# Patient Record
Sex: Female | Born: 1980
Health system: Southern US, Community
[De-identification: ages and names within clinical notes are randomized; demographics above are authoritative.]

## PROBLEM LIST (undated history)

## (undated) DIAGNOSIS — I82409 Acute embolism and thrombosis of unspecified deep veins of unspecified lower extremity: Secondary | ICD-10-CM

## (undated) DIAGNOSIS — E611 Iron deficiency: Secondary | ICD-10-CM

## (undated) DIAGNOSIS — E669 Obesity, unspecified: Secondary | ICD-10-CM

## (undated) DIAGNOSIS — D649 Anemia, unspecified: Secondary | ICD-10-CM

## (undated) DIAGNOSIS — B977 Papillomavirus as the cause of diseases classified elsewhere: Secondary | ICD-10-CM

## (undated) DIAGNOSIS — N83202 Unspecified ovarian cyst, left side: Secondary | ICD-10-CM

## (undated) DIAGNOSIS — E282 Polycystic ovarian syndrome: Secondary | ICD-10-CM

## (undated) DIAGNOSIS — R87619 Unspecified abnormal cytological findings in specimens from cervix uteri: Secondary | ICD-10-CM

## (undated) DIAGNOSIS — E041 Nontoxic single thyroid nodule: Secondary | ICD-10-CM

## (undated) HISTORY — DX: Unspecified abnormal cytological findings in specimens from cervix uteri: R87.619

## (undated) HISTORY — DX: Acute embolism and thrombosis of unspecified deep veins of unspecified lower extremity: I82.409

## (undated) HISTORY — DX: Obesity, unspecified: E66.9

## (undated) HISTORY — DX: Iron deficiency: E61.1

## (undated) HISTORY — DX: Papillomavirus as the cause of diseases classified elsewhere: B97.7

## (undated) HISTORY — DX: Polycystic ovarian syndrome: E28.2

## (undated) HISTORY — PX: HYSTEROSCOPY: SHX211

## (undated) HISTORY — PX: OTHER SURGICAL HISTORY: SHX169

## (undated) HISTORY — DX: Unspecified ovarian cyst, left side: N83.202

---
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
## 1998-05-29 ENCOUNTER — Other Ambulatory Visit: Admission: RE | Admit: 1998-05-29 | Discharge: 1998-05-29 | Payer: Self-pay | Admitting: Gynecology

## 2001-08-15 ENCOUNTER — Other Ambulatory Visit: Admission: RE | Admit: 2001-08-15 | Discharge: 2001-08-15 | Payer: Self-pay | Admitting: Gynecology

## 2002-10-04 ENCOUNTER — Other Ambulatory Visit: Admission: RE | Admit: 2002-10-04 | Discharge: 2002-10-04 | Payer: Self-pay | Admitting: Gynecology

## 2002-10-25 ENCOUNTER — Encounter: Admission: RE | Admit: 2002-10-25 | Discharge: 2002-10-25 | Payer: Self-pay | Admitting: Gynecology

## 2002-10-25 ENCOUNTER — Encounter: Payer: Self-pay | Admitting: Gynecology

## 2003-10-07 ENCOUNTER — Other Ambulatory Visit: Admission: RE | Admit: 2003-10-07 | Discharge: 2003-10-07 | Payer: Self-pay | Admitting: Gynecology

## 2004-10-13 HISTORY — PX: ROUX-EN-Y PROCEDURE: SUR1287

## 2004-10-27 ENCOUNTER — Other Ambulatory Visit: Admission: RE | Admit: 2004-10-27 | Discharge: 2004-10-27 | Payer: Self-pay | Admitting: Gynecology

## 2005-11-10 ENCOUNTER — Other Ambulatory Visit: Admission: RE | Admit: 2005-11-10 | Discharge: 2005-11-10 | Payer: Self-pay | Admitting: Gynecology

## 2006-11-15 ENCOUNTER — Other Ambulatory Visit: Admission: RE | Admit: 2006-11-15 | Discharge: 2006-11-15 | Payer: Self-pay | Admitting: Gynecology

## 2007-11-25 ENCOUNTER — Inpatient Hospital Stay (HOSPITAL_COMMUNITY): Admission: AD | Admit: 2007-11-25 | Discharge: 2007-11-25 | Payer: Self-pay | Admitting: Obstetrics and Gynecology

## 2007-11-25 ENCOUNTER — Inpatient Hospital Stay (HOSPITAL_COMMUNITY): Admission: AD | Admit: 2007-11-25 | Discharge: 2007-11-28 | Payer: Self-pay | Admitting: Obstetrics and Gynecology

## 2007-11-26 ENCOUNTER — Encounter (INDEPENDENT_AMBULATORY_CARE_PROVIDER_SITE_OTHER): Payer: Self-pay | Admitting: Obstetrics and Gynecology

## 2011-02-04 ENCOUNTER — Inpatient Hospital Stay (HOSPITAL_COMMUNITY)
Admission: RE | Admit: 2011-02-04 | Discharge: 2011-02-06 | DRG: 775 | Disposition: A | Discharge status: Home / Self Care | Payer: 59 | Source: Ambulatory Visit | Attending: Obstetrics and Gynecology | Admitting: Obstetrics and Gynecology

## 2011-02-04 LAB — CBC
HCT: 39.6 % (ref 36.0–46.0)
Hemoglobin: 13.3 g/dL (ref 12.0–15.0)
MCH: 31.6 pg (ref 26.0–34.0)
MCHC: 33.6 g/dL (ref 30.0–36.0)
MCV: 94.1 fL (ref 78.0–100.0)
Platelets: 264 10*3/uL (ref 150–400)
RBC: 4.21 MIL/uL (ref 3.87–5.11)
RDW: 13 % (ref 11.5–15.5)
WBC: 11.4 10*3/uL — ABNORMAL HIGH (ref 4.0–10.5)

## 2011-02-04 LAB — RPR: RPR Ser Ql: NONREACTIVE

## 2011-02-05 LAB — CBC
HCT: 32.8 % — ABNORMAL LOW (ref 36.0–46.0)
Hemoglobin: 10.6 g/dL — ABNORMAL LOW (ref 12.0–15.0)
MCH: 30.8 pg (ref 26.0–34.0)
MCHC: 32.3 g/dL (ref 30.0–36.0)
MCV: 95.3 fL (ref 78.0–100.0)
Platelets: 208 10*3/uL (ref 150–400)
RBC: 3.44 MIL/uL — ABNORMAL LOW (ref 3.87–5.11)
RDW: 13.1 % (ref 11.5–15.5)
WBC: 10.9 10*3/uL — ABNORMAL HIGH (ref 4.0–10.5)

## 2011-02-08 ENCOUNTER — Inpatient Hospital Stay (HOSPITAL_COMMUNITY): Admission: AD | Admit: 2011-02-08 | Payer: Self-pay | Admitting: Obstetrics and Gynecology

## 2011-02-09 NOTE — Discharge Summary (Signed)
[name], [name]             ACCOUNT NO.:  [id number]  MEDICAL RECORD NO.:  [id number]           PATIENT TYPE:  I  LOCATION:  9109                          FACILITY:  [facility]  PHYSICIAN:  Malachi Pro. Ambrose Mantle, M.D. DATE OF BIRTH:  16-Dec-1980  DATE OF ADMISSION:  02/04/2011 DATE OF DISCHARGE:  02/06/2011                              DISCHARGE SUMMARY   This is a 30 year old white female para 1-0-0-1 gravida 2 at 67 plus weeks' gestation with an EDC of February 08, 2011, by last period compatible with a 9-week ultrasound who presented for induction of labor.  Given her term status and favorable cervix, prenatal care was significant for a history of neurofibromatosis in the husband and daughter 50% transmission risk.  Blood group and type A+, negative antibody, rubella immune, hepatitis B surface antigen negative, HIV negative, GC and Chlamydia negative, RPR negative, 1-hour Glucola 121. She declined genetics testing, and group B strep was negative.  PAST OBSTETRICS HISTORY:  Spontaneous delivery of a 7 pounds 14 ounces infant in 2008.  She did have abnormal paps in 2008 that resolved.  She had a gastric bypass in 2009, and inferior vena cava filter was placed in 1998.  She did have a DVT after a 13-pound cyst was removed with a left salpingo-oophorectomy in 1998.  She did have a history of anemia. She had DVT secondary to the cyst.  She had a negative workup.  MEDICATIONS:  Prenatal vitamins, calcium, iron, and other vitamins for her gastric bypass.  She had no known drug allergies.  She is an Charity fundraiser, she is married.  She does not smoke, drink, or take drugs.  FAMILY HISTORY:  All grandparents have coronary artery disease.  Father and mother with high blood pressure and grandfather has diabetes mellitus.  PHYSICAL EXAMINATION:  VITAL SIGNS:  On admission, the patient was afebrile with normal vital signs. HEART:  Normal. LUNGS:  Normal. ABDOMEN:  Gravid. PELVIC:  Cervix was 1-2  cm, 50%, -2 station.  Artificial rupture of the membranes produced clear fluid.  At 1:10 p.m., the cervix was 3 cm, 80%.  Intrauterine pressure catheter was placed as the contractions were not tracing.  By 6:15 p.m., she was 6 cm, 80%. Fetal heart tones were reassuring.  She progressed to complete dilatation, pushed great with a spontaneous delivery of a vigorous female infant over an intact perineum by Dr. Senaida Ores.  Weight of the infant 6 pounds 15 ounces, Apgars 8 and 9 at 1 and 5 minutes.  Placenta delivered spontaneously.  Cervix, rectum, and vagina were all intact. Blood loss less than 500 mL.  Postpartum, the patient did well and was discharged on the second postpartum day.  LABORATORY DATA:  Initial hemoglobin 13.3, hematocrit 39.6, white count 11,400, platelet count 264,000, RPR nonreactive.  Followup hemoglobin 10.6, hematocrit 32.8, platelet counts were 264 and 208.  FINAL DIAGNOSES:  Intrauterine pregnancy, 39+ weeks, delivered vertex.  OPERATION:  Spontaneous delivery vertex.  FINAL CONDITION:  Improved. INSTRUCTIONS:  Include our regular discharge instruction booklet.  She is to continue all of her medications she was taking prenatally.  She declines analgesics at discharge  and is advised to return to the office in 6 weeks for followup examination.     Malachi Pro. Ambrose Mantle, M.D.     TFH/MEDQ  D:  02/06/2011  T:  02/06/2011  Job:  [id number]  Electronically Signed by Tracey Harries M.D. on 02/09/2011 09:09:12 AM

## 2011-04-27 NOTE — Discharge Summary (Signed)
[name], [name]             ACCOUNT NO.:  [id number]   MEDICAL RECORD NO.:  [id number]          PATIENT TYPE:  INP   LOCATION:  9121                          [facility]ILITY:  [facility]   PHYSICIAN:  Malachi Pro. Ambrose Mantle, M.D. DATE OF BIRTH:  12-11-1981   DATE OF ADMISSION:  11/25/2007  DATE OF DISCHARGE:  11/28/2007                               DISCHARGE SUMMARY   A 30 year old white married female para 0 gravida 1, EDC November 21, 2007, admitted with possible early labor.  Blood group and type A  positive, negative antibody, nonreactive serology, rubella equivocal,  hepatitis B surface antigen negative, HIV negative, GC and chlamydia  negative.  One-hour Glucola 120.  Group B strep negative.  Declined  first and second trimester screening.  Vaginal ultrasound on March 28, 2007, crown-rump length 5.5 mm, 6 weeks 2 days, [facility] November 21, 2007.  Ultrasound on Apr 14, 2007, crown-rump length 1.99 cm, 8 weeks 4 days,  [facility] November 20, 2007.  Followup ultrasound was normal.  Prenatal care  was complicated by size less than dates.  Ultrasound on October 30, 2007, showed an estimated fetal weight in the 33rd percentile.  She  began contracting on November 25, 2007, at approximately 1:00 a.m.  Contractions were 10-15 minutes apart.  She came to the Maternity  Admission Unit at 10:30 a.m. and remained until 4:30 p.m., but did not  establish good labor pattern.  After returning home, her contractions  became severe at approximately 7:30 p.m. and returned for admission.   ALLERGIES:  No known allergies.   PAST SURGICAL HISTORY:  1998:  Had a left oophorectomy.  November 2005:  A gastric bypass.   ILLNESSES:  The patient has had thrombophlebitis before her surgery for  the left ovarian mass.   FAMILY HISTORY:  Parents with high blood pressure.  Maternal grandfather  with type 2 diabetes.  Maternal and paternal grandparents with heart  disease.   On admission, her vital signs were normal.  Heart  and lungs were normal.  The abdomen was soft, fundal height had been 36 cm on November 24, 2007.  Fetal heart tones were normal.  Contractions every 2-3 minutes.  Cervix  was 3-4 cm, 100%, vertex, at a -1.  Artificial rupture of membranes  showed no definite fluid.  The patient requested an epidural.  She then  progressed to full dilatation, pushed well and delivered a living female  infant over an intact perineum OA.  Dr. Ambrose Mantle was in attendance.  The  infant was female, 7 pounds 14 ounces, Apgars of 9 at 1 and 9 at 5  minutes.  There was meconium-stained fluid.  Nose and pharynx were  suctioned with DeLee and the bulb with the head on the perineum.  Placenta was intact.  Uterus normal.  Blood loss about 400 mL.  Right  labial laceration was hemostatic and not repaired.  Postpartum the  patient did quite well and was discharged on the second postpartum day.   LABORATORY DATA:  Initial hemoglobin was 11.5, hematocrit 34.0, white  count 11,800, platelet count 316,000.  Followup hemoglobin  9.7,  hematocrit 29.2, white count 10,200.   FINAL DIAGNOSIS:  Intrauterine pregnancy at 40+ weeks delivered OA.   OPERATION:  Spontaneous delivery OA.   FINAL CONDITION:  Improved.   DISCHARGE INSTRUCTIONS:  Instructions include our regular discharge  instruction booklet.  The patient is advised to take Percocet 5/325 one  every 4-6 hours as needed for pain, 30 tablets; and take baby aspirin 81  mg every day.   DISCHARGE FOLLOWUP:  She is to return to the office in 6 weeks for  followup examination.      Malachi Pro. Ambrose Mantle, M.D.  Electronically Signed     TFH/MEDQ  D:  11/28/2007  T:  11/28/2007  Job:  [id number]

## 2011-06-23 ENCOUNTER — Encounter (HOSPITAL_BASED_OUTPATIENT_CLINIC_OR_DEPARTMENT_OTHER): Payer: 59 | Admitting: Oncology

## 2011-06-23 ENCOUNTER — Other Ambulatory Visit: Payer: Self-pay | Admitting: Oncology

## 2011-06-23 DIAGNOSIS — D72829 Elevated white blood cell count, unspecified: Secondary | ICD-10-CM

## 2011-06-23 LAB — COMPREHENSIVE METABOLIC PANEL
ALT: 12 U/L (ref 0–35)
AST: 16 U/L (ref 0–37)
Albumin: 4 g/dL (ref 3.5–5.2)
Alkaline Phosphatase: 60 U/L (ref 39–117)
BUN: 21 mg/dL (ref 6–23)
Calcium: 9 mg/dL (ref 8.4–10.5)
Chloride: 105 mEq/L (ref 96–112)
Potassium: 4.3 mEq/L (ref 3.5–5.3)
Sodium: 140 mEq/L (ref 135–145)
Total Protein: 6.2 g/dL (ref 6.0–8.3)

## 2011-06-23 LAB — MORPHOLOGY: RBC Comments: NORMAL

## 2011-06-23 LAB — CBC & DIFF AND RETIC
BASO%: 0.1 % (ref 0.0–2.0)
Basophils Absolute: 0 10*3/uL (ref 0.0–0.1)
HCT: 38.6 % (ref 34.8–46.6)
HGB: 12.9 g/dL (ref 11.6–15.9)
Immature Retic Fract: 3.8 % (ref 0.00–10.70)
MCHC: 33.4 g/dL (ref 31.5–36.0)
MONO#: 0.6 10*3/uL (ref 0.1–0.9)
NEUT%: 53 % (ref 38.4–76.8)
RDW: 12.2 % (ref 11.2–14.5)
Retic Ct Abs: 29.61 10*3/uL (ref 18.30–72.70)
WBC: 7.5 10*3/uL (ref 3.9–10.3)
lymph#: 2.7 10*3/uL (ref 0.9–3.3)

## 2011-06-23 LAB — IRON AND TIBC
TIBC: 313 ug/dL (ref 250–470)
UIBC: 252 ug/dL

## 2011-06-23 LAB — CHCC SMEAR

## 2011-06-23 LAB — FERRITIN: Ferritin: 64 ng/mL (ref 10–291)

## 2011-07-07 ENCOUNTER — Encounter: Payer: 59 | Admitting: Oncology

## 2011-07-27 ENCOUNTER — Ambulatory Visit (INDEPENDENT_AMBULATORY_CARE_PROVIDER_SITE_OTHER): Payer: 59 | Admitting: Internal Medicine

## 2011-07-27 ENCOUNTER — Encounter: Payer: Self-pay | Admitting: Internal Medicine

## 2011-07-27 DIAGNOSIS — Z9884 Bariatric surgery status: Secondary | ICD-10-CM

## 2011-07-27 DIAGNOSIS — I82409 Acute embolism and thrombosis of unspecified deep veins of unspecified lower extremity: Secondary | ICD-10-CM | POA: Insufficient documentation

## 2011-07-27 DIAGNOSIS — B977 Papillomavirus as the cause of diseases classified elsewhere: Secondary | ICD-10-CM | POA: Insufficient documentation

## 2011-07-27 DIAGNOSIS — E282 Polycystic ovarian syndrome: Secondary | ICD-10-CM | POA: Insufficient documentation

## 2011-07-27 DIAGNOSIS — E669 Obesity, unspecified: Secondary | ICD-10-CM

## 2011-07-27 DIAGNOSIS — Z86718 Personal history of other venous thrombosis and embolism: Secondary | ICD-10-CM

## 2011-07-27 DIAGNOSIS — R790 Abnormal level of blood mineral: Secondary | ICD-10-CM

## 2011-07-27 DIAGNOSIS — Z8672 Personal history of thrombophlebitis: Secondary | ICD-10-CM

## 2011-07-27 DIAGNOSIS — N83202 Unspecified ovarian cyst, left side: Secondary | ICD-10-CM | POA: Insufficient documentation

## 2011-07-27 DIAGNOSIS — R7989 Other specified abnormal findings of blood chemistry: Secondary | ICD-10-CM

## 2011-07-27 DIAGNOSIS — E611 Iron deficiency: Secondary | ICD-10-CM | POA: Insufficient documentation

## 2011-07-27 NOTE — Patient Instructions (Signed)
Schedule CPE

## 2011-07-27 NOTE — Progress Notes (Signed)
Subjective:    Patient ID: [name], female    DOB: [birth year], 30 y.o.   MRN: [id number]  HPI  New pt here for firrst visit.  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  PMH of DVT S/P Greenfield filter( felt due to a large L ovarian cyst at age 1)  "it was about 13 lbs", PCOS off Metformin now, Low iron levels without anemia, Obesity S/P Gastric Bypass with weight loss of over 100 lbs, and HPV on pap smear who presents to establish care.    She is doing well,  GYN cafe by Dr. Senaida Ores,  Low iron followed by Dr. Cyndie Chime - she is on oral FE now.  She has not had vitamin levels checked ina while.  She has not had any problems with Diarrhea or dumping syndrome after her gastric bypass,  No Known Allergies Past Medical History  Diagnosis Date  . Abnormal Pap smear of cervix   . Polycystic ovarian syndrome   . HPV (human papilloma virus) infection   . Ovarian cyst, left     S/P excision large cyst  . DVT of lower extremity (deep venous thrombosis)     due to compression from large ovarian cyst  . Iron deficiency     Dr. Cyndie Chime  . Obesity     S/P  Gastric Bypass   Past Surgical History  Procedure Date  . Left oophorectomy 11/98  . Vena cava filter placement 11/98    Greenfield filter  . Roux-en-y procedure 10/2004   History   Social History  . Marital Status: Married    Spouse Name: N/A    Number of Children: N/A  . Years of Education: N/A   Occupational History  . Not on file.   Social History Main Topics  . Smoking status: Never Smoker   . Smokeless tobacco: Never Used  . Alcohol Use: Yes     2 drinks/month  . Drug Use: No  . Sexually Active: Yes    Birth Control/ Protection: IUD   Other Topics Concern  . Not on file   Social History Narrative  . No narrative on file   Family History  Problem Relation Age of Onset  . Hypertension Mother   . Depression Mother   . Osteoporosis Mother   . Thyroid disease Mother     hypo  . Hypertension  Father   . COPD Father   . Thyroid disease Father     hypo  . Sleep apnea Father   . Hypertension Maternal Grandmother   . Heart disease Maternal Grandmother   . Diabetes Maternal Grandmother   . Hypertension Maternal Grandfather   . Heart disease Maternal Grandfather   . Diabetes Maternal Grandfather    Patient Active Problem List  Diagnoses  . HPV (human papilloma virus) infection  . Ovarian cyst, left  . Iron deficiency  . Obesity  . Polycystic ovarian syndrome  . DVT of lower extremity (deep venous thrombosis)   No current outpatient prescriptions on file prior to visit.     Review of Systems SEE HPI    Objective:   Physical Exam Physical Exam  Nursing note and vitals reviewed.  Constitutional: She is oriented to person, place, and time. She appears well-developed and well-nourished.  HENT:  Head: Normocephalic and atraumatic.  Cardiovascular: Normal rate and regular rhythm. Exam reveals no gallop and no friction rub.  No murmur heard.  Pulmonary/Chest: Breath sounds normal. She has no wheezes. She has no rales.  Neurological: She is alert and oriented to person, place, and time.  Skin: Skin is warm and dry.  Psychiatric: She has a normal mood and affect. Her behavior is normal.            Assessment & Plan:  1)  Obesity S/P gastric bypass  Will check Vit D and B12 today with Fe studies.  CBC done recently by Dr. Cyndie Chime.  She is on multiple vitamins with FE now 2) PCOS and HPV  Dr. Senaida Ores 3) h/O DVT  Felt due to compressive effect of very large L ovarian cyst   Shehas a Greenfield filter now  Will schedule CPE at her convienence.  I spent 45 minutes with this pt

## 2011-07-28 LAB — IRON AND TIBC
%SAT: 38 % (ref 20–55)
TIBC: 330 ug/dL (ref 250–470)
UIBC: 203 ug/dL

## 2011-07-28 LAB — COMPREHENSIVE METABOLIC PANEL
ALT: 15 U/L (ref 0–35)
Alkaline Phosphatase: 63 U/L (ref 39–117)
CO2: 25 mEq/L (ref 19–32)
Sodium: 142 mEq/L (ref 135–145)
Total Bilirubin: 0.6 mg/dL (ref 0.3–1.2)
Total Protein: 6.6 g/dL (ref 6.0–8.3)

## 2011-07-28 LAB — LIPID PANEL
Cholesterol: 189 mg/dL (ref 0–200)
LDL Cholesterol: 104 mg/dL — ABNORMAL HIGH (ref 0–99)
VLDL: 10 mg/dL (ref 0–40)

## 2011-07-29 ENCOUNTER — Encounter: Payer: Self-pay | Admitting: Emergency Medicine

## 2011-07-30 LAB — VITAMIN D 1,25 DIHYDROXY
Vitamin D 1, 25 (OH)2 Total: 72 pg/mL (ref 18–72)
Vitamin D3 1, 25 (OH)2: 72 pg/mL

## 2011-08-18 ENCOUNTER — Ambulatory Visit: Payer: 59 | Admitting: Internal Medicine

## 2011-09-07 ENCOUNTER — Ambulatory Visit (INDEPENDENT_AMBULATORY_CARE_PROVIDER_SITE_OTHER): Payer: 59 | Admitting: Internal Medicine

## 2011-09-07 ENCOUNTER — Encounter: Payer: Self-pay | Admitting: Internal Medicine

## 2011-09-07 DIAGNOSIS — Z0000 Encounter for general adult medical examination without abnormal findings: Secondary | ICD-10-CM

## 2011-09-07 DIAGNOSIS — E611 Iron deficiency: Secondary | ICD-10-CM

## 2011-09-07 DIAGNOSIS — R87619 Unspecified abnormal cytological findings in specimens from cervix uteri: Secondary | ICD-10-CM | POA: Insufficient documentation

## 2011-09-07 DIAGNOSIS — E669 Obesity, unspecified: Secondary | ICD-10-CM

## 2011-09-07 NOTE — Patient Instructions (Signed)
You are doing great.  Take meds as prescribed and exercise when you can  Keep up the good work  See me as needed

## 2011-09-07 NOTE — Progress Notes (Signed)
Subjective:    Patient ID: [name], female    DOB: 1981-06-13, 30 y.o.   MRN: [id number]  HPI   [name] is here for a comprehensive evaluation.  She is doing extremely well and very glad that her vitamin levels were so good.   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  I have reviewed meds with her  No Known Allergies Past Medical History  Diagnosis Date  . Abnormal Pap smear of cervix   . Polycystic ovarian syndrome   . HPV (human papilloma virus) infection   . Ovarian cyst, left     S/P excision large cyst  . DVT of lower extremity (deep venous thrombosis)     due to compression from large ovarian cyst  . Iron deficiency     Dr. Cyndie Chime  . Obesity     S/P  Gastric Bypass   Past Surgical History  Procedure Date  . Left oophorectomy 11/98  . Vena cava filter placement 11/98    Greenfield filter  . Roux-en-y procedure 10/2004   History   Social History  . Marital Status: Married    Spouse Name: N/A    Number of Children: N/A  . Years of Education: N/A   Occupational History  . Not on file.   Social History Main Topics  . Smoking status: Never Smoker   . Smokeless tobacco: Never Used  . Alcohol Use: Yes     2 drinks/month  . Drug Use: No  . Sexually Active: Yes    Birth Control/ Protection: IUD   Other Topics Concern  . Not on file   Social History Narrative  . No narrative on file   Family History  Problem Relation Age of Onset  . Hypertension Mother   . Depression Mother   . Osteoporosis Mother   . Thyroid disease Mother     hypo  . Hypertension Father   . COPD Father   . Thyroid disease Father     hypo  . Sleep apnea Father   . Hypertension Maternal Grandmother   . Heart disease Maternal Grandmother   . Diabetes Maternal Grandmother   . Hypertension Maternal Grandfather   . Heart disease Maternal Grandfather   . Diabetes Maternal Grandfather    Patient Active Problem List  Diagnoses  . HPV (human  papilloma virus) infection  . Ovarian cyst, left  . Iron deficiency  . Obesity  . Polycystic ovarian syndrome  . DVT of lower extremity (deep venous thrombosis)   Current Outpatient Prescriptions on File Prior to Visit  Medication Sig Dispense Refill  . calcium elemental as carbonate (BARIATRIC TUMS ULTRA) 400 MG tablet Chew 6,000 mg by mouth daily.        . Cholecalciferol (VITAMIN D-3) 5000 UNITS TABS Take 1 tablet by mouth daily.        . Cyanocobalamin (VITAMIN B-12) 1000 MCG SUBL Place 1 each under the tongue daily.        . Docosahexaenoic Acid (DHA COMPLETE) 200 MG CAPS Take 1 capsule by mouth daily.        Marland Kitchen docusate sodium (COLACE) 100 MG capsule Take 100 mg by mouth every other day.        . Iron-Vitamin C 100-250 MG TABS Take 1 tablet by mouth daily.        . Multiple Vitamin (MULTIVITAMIN) tablet Take 1 tablet by mouth daily.  Review of Systems No chest pain, no sob, no Le edema  No blood or change in color of stool  Appetite good   See HPI Objective:   Physical Exam  Physical Exam  Nursing note and vitals reviewed.  Constitutional: She is oriented to person, place, and time. She appears well-developed and well-nourished.  HENT:  Head: Normocephalic and atraumatic.  Right Ear: Tympanic membrane and ear canal normal. No drainage. Tympanic membrane is not injected and not erythematous.  Left Ear: Tympanic membrane and ear canal normal. No drainage. Tympanic membrane is not injected and not erythematous.  Nose: Nose normal. Right sinus exhibits no maxillary sinus tenderness and no frontal sinus tenderness. Left sinus exhibits no maxillary sinus tenderness and no frontal sinus tenderness.  Mouth/Throat: Oropharynx is clear and moist. No oral lesions. No oropharyngeal exudate.  Eyes: Conjunctivae and EOM are normal. Pupils are equal, round, and reactive to light.  Neck: Normal range of motion. Neck supple. No JVD present. Carotid bruit is not present. No mass  and no thyromegaly present.  Cardiovascular: Normal rate, regular rhythm, S1 normal, S2 normal and intact distal pulses. Exam reveals no gallop and no friction rub.  No murmur heard.  Pulses:  Carotid pulses are 2+ on the right side, and 2+ on the left side.  Dorsalis pedis pulses are 2+ on the right side, and 2+ on the left side.  No carotid bruit. No LE edema  Pulmonary/Chest: Breath sounds normal. She has no wheezes. She has no rales. She exhibits no tenderness.  Abdominal: Soft. Bowel sounds are normal. She exhibits no distension and no mass. There is no hepatosplenomegaly. There is no tenderness. There is no CVA tenderness.  Musculoskeletal: Normal range of motion.  No active synovitis to joints.  Lymphadenopathy:  She has no cervical adenopathy.  She has no axillary adenopathy.  Right: No inguinal and no supraclavicular adenopathy present.  Left: No inguinal and no supraclavicular adenopathy present.  Neurological: She is alert and oriented to person, place, and time. She has normal strength and normal reflexes. She displays no tremor. No cranial nerve deficit or sensory deficit. Coordination and gait normal.  Skin: Skin is warm and dry. No rash noted. No cyanosis. Nails show no clubbing.  Psychiatric: She has a normal mood and affect. Her speech is normal and behavior is normal. Cognition and memory are normal.          Assessment & Plan:  1)  Health Maintenance:  Advised exercise,  She is very good at taking vitamin D  Gave copy of dash diet and Zoster info.  She has appt with GYN for pap next month 2)  Obesity S/P gastric bypass 3)  H/O  DVT  S/P greenfield filter 4)PCOS  I spent 45 minutes with this pt

## 2011-09-17 LAB — CBC
Platelets: 244
RBC: 3.33 — ABNORMAL LOW
WBC: 10.2

## 2011-09-20 LAB — CBC
HCT: 34 — ABNORMAL LOW
Hemoglobin: 11.5 — ABNORMAL LOW
MCV: 86.3
RBC: 3.94
WBC: 11.8 — ABNORMAL HIGH

## 2011-12-10 ENCOUNTER — Telehealth: Payer: Self-pay | Admitting: *Deleted

## 2012-06-20 ENCOUNTER — Ambulatory Visit: Payer: 59 | Admitting: Oncology

## 2012-06-21 ENCOUNTER — Encounter: Payer: Self-pay | Admitting: Oncology

## 2012-06-21 NOTE — Progress Notes (Signed)
30 year old woman I saw her for a 1 time office consult 1 year ago. She has presumed iron malabsorption anemia secondary to previous gastric bypass surgery. She has a history of a DVT due to mechanical compression of iliac vein by an ovarian cyst and has a vena cava filter in place. She is not on chronic anticoagulation. I got her established with Dr. Norton Blizzard for primary care and Epic notes documented she did get established with her. It looks like she recently had a new baby. She did not keep her appointment today. I would be happy to see her again on an as-needed basis. This is end of dictation

## 2013-04-12 ENCOUNTER — Encounter: Payer: Self-pay | Admitting: Internal Medicine

## 2013-04-12 ENCOUNTER — Ambulatory Visit (INDEPENDENT_AMBULATORY_CARE_PROVIDER_SITE_OTHER): Payer: BC Managed Care – PPO | Admitting: Internal Medicine

## 2013-04-12 VITALS — BP 119/77 | HR 77 | Temp 97.8°F | Resp 18 | Ht 64.0 in | Wt 256.0 lb

## 2013-04-12 DIAGNOSIS — D509 Iron deficiency anemia, unspecified: Secondary | ICD-10-CM

## 2013-04-12 DIAGNOSIS — B977 Papillomavirus as the cause of diseases classified elsewhere: Secondary | ICD-10-CM

## 2013-04-12 DIAGNOSIS — I824Z9 Acute embolism and thrombosis of unspecified deep veins of unspecified distal lower extremity: Secondary | ICD-10-CM

## 2013-04-12 DIAGNOSIS — I82409 Acute embolism and thrombosis of unspecified deep veins of unspecified lower extremity: Secondary | ICD-10-CM

## 2013-04-12 DIAGNOSIS — E611 Iron deficiency: Secondary | ICD-10-CM

## 2013-04-12 DIAGNOSIS — Z0000 Encounter for general adult medical examination without abnormal findings: Secondary | ICD-10-CM

## 2013-04-12 DIAGNOSIS — E669 Obesity, unspecified: Secondary | ICD-10-CM

## 2013-04-12 LAB — CBC WITH DIFFERENTIAL/PLATELET
Basophils Relative: 0 % (ref 0–1)
Eosinophils Absolute: 0.1 10*3/uL (ref 0.0–0.7)
Eosinophils Relative: 1 % (ref 0–5)
Lymphs Abs: 3.3 10*3/uL (ref 0.7–4.0)
MCH: 28.9 pg (ref 26.0–34.0)
MCHC: 33.2 g/dL (ref 30.0–36.0)
MCV: 87.1 fL (ref 78.0–100.0)
Monocytes Relative: 8 % (ref 3–12)
Neutrophils Relative %: 49 % (ref 43–77)
Platelets: 332 10*3/uL (ref 150–400)

## 2013-04-12 LAB — LIPID PANEL
Cholesterol: 173 mg/dL (ref 0–200)
Total CHOL/HDL Ratio: 2.7 Ratio
Triglycerides: 66 mg/dL (ref <150)
VLDL: 13 mg/dL (ref 0–40)

## 2013-04-12 LAB — COMPREHENSIVE METABOLIC PANEL
ALT: 12 U/L (ref 0–35)
Albumin: 4.1 g/dL (ref 3.5–5.2)
CO2: 28 mEq/L (ref 19–32)
Calcium: 9.3 mg/dL (ref 8.4–10.5)
Chloride: 106 mEq/L (ref 96–112)
Potassium: 4.4 mEq/L (ref 3.5–5.3)
Sodium: 141 mEq/L (ref 135–145)
Total Bilirubin: 0.4 mg/dL (ref 0.3–1.2)
Total Protein: 6.6 g/dL (ref 6.0–8.3)

## 2013-04-12 NOTE — Patient Instructions (Addendum)
See me as needed  Labs will be mailed to you 

## 2013-04-13 LAB — VITAMIN D 25 HYDROXY (VIT D DEFICIENCY, FRACTURES)
Vit D, 25-Hydroxy: 51 ng/mL (ref 30–89)
Vit D, 25-Hydroxy: 52 ng/mL (ref 30–89)

## 2013-04-16 ENCOUNTER — Encounter: Payer: Self-pay | Admitting: *Deleted

## 2013-04-16 NOTE — Progress Notes (Signed)
Subjective:    Patient ID: [name], female    DOB: [birth year], 32 y.o.   MRN: [id number]  HPI [name] is here for her CPE.  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 Senaida Ores  Gastric bypass  She is taking her bariatric vitamins.   No Known Allergies Past Medical History  Diagnosis Date  . Abnormal Pap smear of cervix   . Polycystic ovarian syndrome   . HPV (human papilloma virus) infection   . Ovarian cyst, left     S/P excision large cyst  . DVT of lower extremity (deep venous thrombosis)     due to compression from large ovarian cyst  . Iron deficiency     Dr. Cyndie Chime  . Obesity     S/P  Gastric Bypass   Past Surgical History  Procedure Laterality Date  . Left oophorectomy  11/98  . Vena cava filter placement  11/98    Greenfield filter  . Roux-en-y procedure  10/2004  . Hysteroscopy     History   Social History  . Marital Status: Married    Spouse Name: N/A    Number of Children: N/A  . Years of Education: N/A   Occupational History  . Not on file.   Social History Main Topics  . Smoking status: Never Smoker   . Smokeless tobacco: Never Used  . Alcohol Use: Yes     Comment: 2 drinks/month  . Drug Use: No  . Sexually Active: Yes   Other Topics Concern  . Not on file   Social History Narrative  . No narrative on file   Family History  Problem Relation Age of Onset  . Hypertension Mother   . Depression Mother   . Osteoporosis Mother   . Thyroid disease Mother     hypo  . Hypertension Father   . COPD Father   . Thyroid disease Father     hypo  . Sleep apnea Father   . Hypertension Maternal Grandmother   . Heart disease Maternal Grandmother   . Diabetes Maternal Grandmother   . Hypertension Maternal Grandfather   . Heart disease Maternal Grandfather   . Diabetes Maternal Grandfather    Patient Active Problem List   Diagnosis Date Noted  . Morbid obesity 04/16/2013  . Abnormal Pap smear of cervix   . HPV (human papilloma virus) infection   . Ovarian cyst, left   . Iron deficiency   . Obesity   . Polycystic ovarian syndrome   . DVT of lower extremity (deep venous thrombosis)    Current Outpatient Prescriptions on File Prior to Visit  Medication Sig Dispense Refill  . calcium elemental as carbonate (BARIATRIC TUMS ULTRA) 400 MG tablet Chew 6,000 mg by mouth daily.        . Cholecalciferol (VITAMIN D-3) 5000 UNITS TABS Take 1 tablet by mouth daily.        . Cyanocobalamin (VITAMIN B-12) 1000 MCG SUBL Place 1 each under the tongue daily.        . Docosahexaenoic Acid (DHA COMPLETE) 200 MG CAPS Take 1 capsule by mouth daily.        . Iron-Vitamin C 100-250 MG TABS Take 1 tablet by mouth daily.        . Multiple Vitamin (MULTIVITAMIN) tablet Take 1 tablet by mouth daily.         No  current facility-administered medications on file prior to visit.       Review of Systems  All other systems reviewed and are negative.       Objective:   Physical Exam Physical Exam  Nursing note and vitals reviewed.  Constitutional: She is oriented to person, place, and time. She appears well-developed and well-nourished.  HENT:  Head: Normocephalic and atraumatic.  Right Ear: Tympanic membrane and ear canal normal. No drainage. Tympanic membrane is not injected and not erythematous.  Left Ear: Tympanic membrane and ear canal normal. No drainage. Tympanic membrane is not injected and not erythematous.  Nose: Nose normal. Right sinus exhibits no maxillary sinus tenderness and no frontal sinus tenderness. Left sinus exhibits no maxillary sinus tenderness and no frontal sinus tenderness.  Mouth/Throat: Oropharynx is clear and moist. No oral lesions. No oropharyngeal exudate.  Eyes: Conjunctivae and EOM are normal. Pupils are equal, round, and reactive to light.  Neck: Normal range of motion. Neck supple. No JVD  present. Carotid bruit is not present. No mass and no thyromegaly present.  Cardiovascular: Normal rate, regular rhythm, S1 normal, S2 normal and intact distal pulses. Exam reveals no gallop and no friction rub.  No murmur heard.  Pulses:  Carotid pulses are 2+ on the right side, and 2+ on the left side.  Dorsalis pedis pulses are 2+ on the right side, and 2+ on the left side.  No carotid bruit. No LE edema  Pulmonary/Chest: Breath sounds normal. She has no wheezes. She has no rales. She exhibits no tenderness.  Abdominal: Soft. Bowel sounds are normal. She exhibits no distension and no mass. There is no hepatosplenomegaly. There is no tenderness. There is no CVA tenderness.  Musculoskeletal: Normal range of motion.  No active synovitis to joints.  Lymphadenopathy:  She has no cervical adenopathy.  She has no axillary adenopathy.  Right: No inguinal and no supraclavicular adenopathy present.  Left: No inguinal and no supraclavicular adenopathy present.  Neurological: She is alert and oriented to person, place, and time. She has normal strength and normal reflexes. She displays no tremor. No cranial nerve deficit or sensory deficit. Coordination and gait normal.  Skin: Skin is warm and dry. No rash noted. No cyanosis. Nails show no clubbing.  Psychiatric: She has a normal mood and affect. Her speech is normal and behavior is normal. Cognition and memory are normal.          Assessment & Plan:  Health maintenance  UTD with vaccines.  Will check lipids today     HIstory of DVT  Gastric bypass will check B12 and iron today.  Continue baritatric vitamins  History of HPV   Managed by her GYN Dr. Senaida Ores.    History of PCOS.  Will check glucose today  Morbid obesity

## 2013-04-17 ENCOUNTER — Encounter: Payer: Self-pay | Admitting: *Deleted

## 2013-06-04 LAB — OB RESULTS CONSOLE HEPATITIS B SURFACE ANTIGEN: Hepatitis B Surface Ag: NEGATIVE

## 2013-06-04 LAB — OB RESULTS CONSOLE RUBELLA ANTIBODY, IGM: RUBELLA: IMMUNE

## 2013-06-04 LAB — OB RESULTS CONSOLE GC/CHLAMYDIA
CHLAMYDIA, DNA PROBE: NEGATIVE
GC PROBE AMP, GENITAL: NEGATIVE

## 2013-06-04 LAB — OB RESULTS CONSOLE ABO/RH: RH Type: POSITIVE

## 2013-06-04 LAB — OB RESULTS CONSOLE RPR: RPR: NONREACTIVE

## 2013-06-04 LAB — OB RESULTS CONSOLE ANTIBODY SCREEN: ANTIBODY SCREEN: NEGATIVE

## 2013-06-04 LAB — OB RESULTS CONSOLE HIV ANTIBODY (ROUTINE TESTING): HIV: NONREACTIVE

## 2013-10-31 ENCOUNTER — Other Ambulatory Visit (HOSPITAL_COMMUNITY): Payer: Self-pay | Admitting: Obstetrics and Gynecology

## 2013-10-31 DIAGNOSIS — E049 Nontoxic goiter, unspecified: Secondary | ICD-10-CM

## 2013-11-09 ENCOUNTER — Ambulatory Visit (HOSPITAL_COMMUNITY)
Admission: RE | Admit: 2013-11-09 | Discharge: 2013-11-09 | Disposition: A | Discharge status: Home / Self Care | Payer: BC Managed Care – PPO | Source: Ambulatory Visit | Attending: Obstetrics and Gynecology | Admitting: Obstetrics and Gynecology

## 2013-11-09 DIAGNOSIS — E042 Nontoxic multinodular goiter: Secondary | ICD-10-CM | POA: Insufficient documentation

## 2013-11-09 DIAGNOSIS — E049 Nontoxic goiter, unspecified: Secondary | ICD-10-CM

## 2013-11-15 ENCOUNTER — Telehealth: Payer: Self-pay | Admitting: Internal Medicine

## 2013-11-15 NOTE — Telephone Encounter (Signed)
Spoke with pt and she states she has spoken with Her OB Dr. Ambrose Mantle and she will be referred endocrinologist  Seh tells me she found out she was pregnant shortly after visit in [facility] in May of this year

## 2013-12-12 LAB — OB RESULTS CONSOLE GBS: STREP GROUP B AG: NEGATIVE

## 2013-12-13 NOTE — L&D Delivery Note (Signed)
Delivery Note Pt progressed to complete dilation and pushed great. At 3:07 PM a healthy female was delivered via  (Presentation: LOA).  APGAR: 7,9 ; weigh tpending .   Placenta status:delivered spontaneously  .  Cord:  with the following complications: none.  Terminal meconium  Anesthesia:  epidural  Episiotomy: none Lacerations: none Suture Repair: n/a Est. Blood Loss (mL): 350cc  Mom to postpartum.  Baby to Couplet care / Skin to Skin.  Logan Bores 01/01/2014, 3:18 PM

## 2013-12-19 ENCOUNTER — Other Ambulatory Visit: Payer: Self-pay | Admitting: Endocrinology

## 2013-12-19 DIAGNOSIS — E042 Nontoxic multinodular goiter: Secondary | ICD-10-CM

## 2013-12-26 ENCOUNTER — Telehealth (HOSPITAL_COMMUNITY): Payer: Self-pay | Admitting: *Deleted

## 2013-12-26 ENCOUNTER — Encounter (HOSPITAL_COMMUNITY): Payer: Self-pay | Admitting: *Deleted

## 2013-12-26 NOTE — Telephone Encounter (Signed)
Preadmission screen  

## 2013-12-31 NOTE — H&P (Addendum)
[name] is a 33 y.o. female G3P2002 at 39+ weeks (EDD 01/02/14 by LMP c/w 7 week [place]) presenting for IOL at term.  Prenatal care has been uncomplicated.  The pt has a h/o gastric bypass surgery and some secondary anemia.  SHe also had a DVT in the past from a large pelvic mass and had an IVC filter placed when mass removed.  Mass was benign and she was not thought to have increased risk of DVT after that.  Her husband and daughter have mild neurofibromatosis.  She was diagnosed with thyroid nodules this pregnancy, and is being followed by Dr. Chalmers Cater with a biopsy planned after delivery.  Maternal Medical History:  Fetal activity: Perceived fetal activity is normal.    Prenatal Complications - Diabetes: none.    OB History   Grav Para Term Preterm Abortions TAB SAB Ect Mult Living   3 2 2       2     2008 7#14oz NSVD 2012 6#15oz NSVD  Past Medical History  Diagnosis Date  . Abnormal Pap smear of cervix   . Polycystic ovarian syndrome   . HPV (human papilloma virus) infection   . Ovarian cyst, left     S/P excision large cyst  . DVT of lower extremity (deep venous thrombosis)     due to compression from large ovarian cyst  . Iron deficiency     Dr. Beryle Beams  . Obesity     S/P  Gastric Bypass   Past Surgical History  Procedure Laterality Date  . Left oophorectomy  11/98  . Vena cava filter placement  11/98    [place] filter  . Roux-en-y procedure  10/2004  . Hysteroscopy    . Gastric by-pass     Family History: family history includes COPD in her father; Depression in her mother; Diabetes in her maternal grandfather and maternal grandmother; Heart disease in her maternal grandfather and maternal grandmother; Hypertension in her father, maternal grandfather, maternal grandmother, and mother; Osteoporosis in her mother; Other in her daughter; Sleep apnea in her father; Thyroid disease in her father and mother. Social History:  reports that she has never smoked. She has  never used smokeless tobacco. She reports that she drinks alcohol. She reports that she does not use illicit drugs.   Prenatal Transfer Tool  Maternal Diabetes: No Genetic Screening: Declined Maternal Ultrasounds/Referrals: Normal Fetal Ultrasounds or other Referrals:  None Maternal Substance Abuse:  No Significant Maternal Medications:  None Significant Maternal Lab Results:  None Other Comments:  FOB and pt's daughter have mild form of neurofibromatosis  ROS    Last menstrual period 03/30/2013. Maternal Exam:  Uterine Assessment: Contraction strength is mild.  Contraction frequency is irregular.   Abdomen: Patient reports no abdominal tenderness. Fetal presentation: vertex  Introitus: Normal vulva. Normal vagina.    Physical Exam  Constitutional: She is oriented to person, place, and time. She appears well-developed and well-nourished.  Cardiovascular: Normal rate and regular rhythm.   Respiratory: Effort normal and breath sounds normal.  GI: Soft.  Genitourinary: Vagina normal.  Neurological: She is alert and oriented to person, place, and time.    Prenatal labs: ABO, Rh: A/Positive/-- (06/23 0000) Antibody: Negative (06/23 0000) Rubella: Immune (06/23 0000) RPR: Nonreactive (06/23 0000)  HBsAg: Negative (06/23 0000)  HIV: Non-reactive (06/23 0000)  GBS: Negative (12/31 0000)  Declined genetic screenings One hour GTT 106  Assessment/Plan: Pt for IOL at term with favorable cervix.  Plan pitocin and AROM.  Logan Bores 12/31/2013, 8:29 PM

## 2014-01-01 ENCOUNTER — Encounter (HOSPITAL_COMMUNITY): Payer: BC Managed Care – PPO | Admitting: Anesthesiology

## 2014-01-01 ENCOUNTER — Inpatient Hospital Stay (HOSPITAL_COMMUNITY)
Admission: RE | Admit: 2014-01-01 | Discharge: 2014-01-02 | DRG: 775 | Disposition: A | Discharge status: Home / Self Care | Payer: BC Managed Care – PPO | Source: Ambulatory Visit | Attending: Obstetrics and Gynecology | Admitting: Obstetrics and Gynecology

## 2014-01-01 ENCOUNTER — Inpatient Hospital Stay (HOSPITAL_COMMUNITY): Payer: BC Managed Care – PPO | Admitting: Anesthesiology

## 2014-01-01 ENCOUNTER — Encounter (HOSPITAL_COMMUNITY): Payer: Self-pay

## 2014-01-01 VITALS — BP 102/69 | HR 66 | Temp 98.7°F | Resp 18 | Ht 64.5 in | Wt 251.0 lb

## 2014-01-01 DIAGNOSIS — E041 Nontoxic single thyroid nodule: Secondary | ICD-10-CM | POA: Diagnosis present

## 2014-01-01 DIAGNOSIS — Z86718 Personal history of other venous thrombosis and embolism: Secondary | ICD-10-CM

## 2014-01-01 DIAGNOSIS — O99284 Endocrine, nutritional and metabolic diseases complicating childbirth: Principal | ICD-10-CM

## 2014-01-01 DIAGNOSIS — E079 Disorder of thyroid, unspecified: Principal | ICD-10-CM | POA: Diagnosis present

## 2014-01-01 DIAGNOSIS — O99844 Bariatric surgery status complicating childbirth: Secondary | ICD-10-CM | POA: Diagnosis present

## 2014-01-01 DIAGNOSIS — Z3490 Encounter for supervision of normal pregnancy, unspecified, unspecified trimester: Secondary | ICD-10-CM

## 2014-01-01 DIAGNOSIS — D509 Iron deficiency anemia, unspecified: Secondary | ICD-10-CM | POA: Diagnosis present

## 2014-01-01 DIAGNOSIS — O9902 Anemia complicating childbirth: Secondary | ICD-10-CM | POA: Diagnosis present

## 2014-01-01 LAB — TYPE AND SCREEN
ABO/RH(D): A POS
Antibody Screen: NEGATIVE

## 2014-01-01 LAB — CBC
HCT: 33.1 % — ABNORMAL LOW (ref 36.0–46.0)
Hemoglobin: 11 g/dL — ABNORMAL LOW (ref 12.0–15.0)
MCH: 29.2 pg (ref 26.0–34.0)
MCHC: 33.2 g/dL (ref 30.0–36.0)
MCV: 87.8 fL (ref 78.0–100.0)
PLATELETS: 288 10*3/uL (ref 150–400)
RBC: 3.77 MIL/uL — AB (ref 3.87–5.11)
RDW: 13.5 % (ref 11.5–15.5)
WBC: 10.3 10*3/uL (ref 4.0–10.5)

## 2014-01-01 LAB — ABO/RH: ABO/RH(D): A POS

## 2014-01-01 LAB — RPR: RPR Ser Ql: NONREACTIVE

## 2014-01-01 MED ORDER — LIDOCAINE HCL (PF) 1 % IJ SOLN
30.0000 mL | INTRAMUSCULAR | Status: DC | PRN
  Filled 2014-01-01: qty 30

## 2014-01-01 MED ORDER — PHENYLEPHRINE 40 MCG/ML (10ML) SYRINGE FOR IV PUSH (FOR BLOOD PRESSURE SUPPORT)
80.0000 ug | PREFILLED_SYRINGE | INTRAVENOUS | Status: DC | PRN
  Filled 2014-01-01: qty 2

## 2014-01-01 MED ORDER — CITRIC ACID-SODIUM CITRATE 334-500 MG/5ML PO SOLN: 30.0000 mL | ORAL | Status: DC | PRN

## 2014-01-01 MED ORDER — ONDANSETRON HCL 4 MG/2ML IJ SOLN: 4.0000 mg | Freq: Four times a day (QID) | INTRAMUSCULAR | Status: DC | PRN

## 2014-01-01 MED ORDER — ONDANSETRON HCL 4 MG PO TABS: 4.0000 mg | ORAL_TABLET | ORAL | Status: DC | PRN

## 2014-01-01 MED ORDER — DIPHENHYDRAMINE HCL 25 MG PO CAPS: 25.0000 mg | ORAL_CAPSULE | Freq: Four times a day (QID) | ORAL | Status: DC | PRN

## 2014-01-01 MED ORDER — FENTANYL 2.5 MCG/ML BUPIVACAINE 1/10 % EPIDURAL INFUSION (WH - ANES)
14.0000 mL/h | INTRAMUSCULAR | Status: DC | PRN
  Administered 2014-01-01: 14 mL/h via EPIDURAL

## 2014-01-01 MED ORDER — DIPHENHYDRAMINE HCL 50 MG/ML IJ SOLN: 12.5000 mg | INTRAMUSCULAR | Status: DC | PRN

## 2014-01-01 MED ORDER — PHENYLEPHRINE 40 MCG/ML (10ML) SYRINGE FOR IV PUSH (FOR BLOOD PRESSURE SUPPORT)
PREFILLED_SYRINGE | INTRAVENOUS | Status: AC
  Filled 2014-01-01: qty 10

## 2014-01-01 MED ORDER — ACETAMINOPHEN 325 MG PO TABS: 650.0000 mg | ORAL_TABLET | ORAL | Status: DC | PRN

## 2014-01-01 MED ORDER — TETANUS-DIPHTH-ACELL PERTUSSIS 5-2.5-18.5 LF-MCG/0.5 IM SUSP: 0.5000 mL | Freq: Once | INTRAMUSCULAR | Status: DC

## 2014-01-01 MED ORDER — TERBUTALINE SULFATE 1 MG/ML IJ SOLN: 0.2500 mg | Freq: Once | INTRAMUSCULAR | Status: DC | PRN

## 2014-01-01 MED ORDER — EPHEDRINE 5 MG/ML INJ
10.0000 mg | INTRAVENOUS | Status: DC | PRN
  Filled 2014-01-01: qty 2

## 2014-01-01 MED ORDER — SODIUM BICARBONATE 8.4 % IV SOLN
INTRAVENOUS | Status: DC | PRN
  Administered 2014-01-01: 5 mL via EPIDURAL

## 2014-01-01 MED ORDER — BENZOCAINE-MENTHOL 20-0.5 % EX AERO
1.0000 "application " | INHALATION_SPRAY | CUTANEOUS | Status: DC | PRN
  Administered 2014-01-01: 1 via TOPICAL
  Filled 2014-01-01: qty 56

## 2014-01-01 MED ORDER — ONDANSETRON HCL 4 MG/2ML IJ SOLN: 4.0000 mg | INTRAMUSCULAR | Status: DC | PRN

## 2014-01-01 MED ORDER — DIBUCAINE 1 % RE OINT
1.0000 | TOPICAL_OINTMENT | RECTAL | Status: DC | PRN
Start: 2014-01-01 — End: 2014-01-02
  Administered 2014-01-01: 1 via RECTAL
  Filled 2014-01-01: qty 28

## 2014-01-01 MED ORDER — IBUPROFEN 600 MG PO TABS: 600.0000 mg | ORAL_TABLET | Freq: Four times a day (QID) | ORAL | Status: DC | PRN

## 2014-01-01 MED ORDER — SENNOSIDES-DOCUSATE SODIUM 8.6-50 MG PO TABS
2.0000 | ORAL_TABLET | ORAL | Status: DC
  Administered 2014-01-02: 2 via ORAL
  Filled 2014-01-01: qty 2

## 2014-01-01 MED ORDER — OXYTOCIN 40 UNITS IN LACTATED RINGERS INFUSION - SIMPLE MED: 62.5000 mL/h | INTRAVENOUS | Status: DC

## 2014-01-01 MED ORDER — ZOLPIDEM TARTRATE 5 MG PO TABS: 5.0000 mg | ORAL_TABLET | Freq: Every evening | ORAL | Status: DC | PRN

## 2014-01-01 MED ORDER — IBUPROFEN 600 MG PO TABS
600.0000 mg | ORAL_TABLET | Freq: Four times a day (QID) | ORAL | Status: DC
  Administered 2014-01-01 – 2014-01-02 (×4): 600 mg via ORAL
  Filled 2014-01-01 (×4): qty 1

## 2014-01-01 MED ORDER — LACTATED RINGERS IV SOLN: 500.0000 mL | Freq: Once | INTRAVENOUS | Status: DC

## 2014-01-01 MED ORDER — WITCH HAZEL-GLYCERIN EX PADS
1.0000 "application " | MEDICATED_PAD | CUTANEOUS | Status: DC | PRN
  Administered 2014-01-01: 1 via TOPICAL

## 2014-01-01 MED ORDER — LACTATED RINGERS IV SOLN: 500.0000 mL | INTRAVENOUS | Status: DC | PRN

## 2014-01-01 MED ORDER — EPHEDRINE 5 MG/ML INJ
INTRAVENOUS | Status: AC
  Filled 2014-01-01: qty 4

## 2014-01-01 MED ORDER — SIMETHICONE 80 MG PO CHEW: 80.0000 mg | CHEWABLE_TABLET | ORAL | Status: DC | PRN

## 2014-01-01 MED ORDER — LACTATED RINGERS IV SOLN
INTRAVENOUS | Status: DC
Start: 2014-01-01 — End: 2014-01-01
  Administered 2014-01-01: 12:00:00 via INTRAUTERINE

## 2014-01-01 MED ORDER — OXYCODONE-ACETAMINOPHEN 5-325 MG PO TABS: 1.0000 | ORAL_TABLET | ORAL | Status: DC | PRN

## 2014-01-01 MED ORDER — LACTATED RINGERS IV SOLN
INTRAVENOUS | Status: DC
  Administered 2014-01-01: 13:00:00 via INTRAVENOUS

## 2014-01-01 MED ORDER — FENTANYL 2.5 MCG/ML BUPIVACAINE 1/10 % EPIDURAL INFUSION (WH - ANES)
INTRAMUSCULAR | Status: AC
  Filled 2014-01-01: qty 125

## 2014-01-01 MED ORDER — PRENATAL MULTIVITAMIN CH
1.0000 | ORAL_TABLET | Freq: Every day | ORAL | Status: DC
  Administered 2014-01-02: 1 via ORAL
  Filled 2014-01-01: qty 1

## 2014-01-01 MED ORDER — OXYTOCIN 40 UNITS IN LACTATED RINGERS INFUSION - SIMPLE MED
1.0000 m[IU]/min | INTRAVENOUS | Status: DC
  Administered 2014-01-01: 2 m[IU]/min via INTRAVENOUS
  Filled 2014-01-01: qty 1000

## 2014-01-01 MED ORDER — OXYTOCIN BOLUS FROM INFUSION: 500.0000 mL | INTRAVENOUS | Status: DC

## 2014-01-01 MED ORDER — LANOLIN HYDROUS EX OINT: TOPICAL_OINTMENT | CUTANEOUS | Status: DC | PRN

## 2014-01-01 NOTE — Progress Notes (Signed)
Patient ID: [name], female   DOB: Nov 02, 1981, 33 y.o.   MRN: [id number] Comfortable with epidural  afeb vss FHR reassuring Cervix 90/4/0  Following progress.

## 2014-01-01 NOTE — Anesthesia Preprocedure Evaluation (Signed)

## 2014-01-01 NOTE — Progress Notes (Signed)
Patient ID: [name], female   DOB: 02/24/1981, 33 y.o.   MRN: [id number] Pt with mild contractions on pitocin afeb vss Cervix 50/2/-2 AROM clear FHR category 1  Fo9llow progress, epidural prn.

## 2014-01-01 NOTE — Anesthesia Procedure Notes (Signed)

## 2014-01-02 ENCOUNTER — Inpatient Hospital Stay (HOSPITAL_COMMUNITY)
Admission: AD | Admit: 2014-01-02 | Payer: BC Managed Care – PPO | Source: Ambulatory Visit | Admitting: Obstetrics and Gynecology

## 2014-01-02 LAB — CBC
HEMATOCRIT: 30.6 % — AB (ref 36.0–46.0)
Hemoglobin: 10.2 g/dL — ABNORMAL LOW (ref 12.0–15.0)
MCH: 29.3 pg (ref 26.0–34.0)
MCHC: 33.3 g/dL (ref 30.0–36.0)
MCV: 87.9 fL (ref 78.0–100.0)
Platelets: 244 10*3/uL (ref 150–400)
RBC: 3.48 MIL/uL — AB (ref 3.87–5.11)
RDW: 13.7 % (ref 11.5–15.5)
WBC: 10.2 10*3/uL (ref 4.0–10.5)

## 2014-01-02 MED ORDER — IBUPROFEN 600 MG PO TABS: 600.0000 mg | ORAL_TABLET | Freq: Four times a day (QID) | ORAL | Status: AC

## 2014-01-02 NOTE — Anesthesia Postprocedure Evaluation (Signed)
  Anesthesia Post-op Note  Patient: [name]  Procedure(s) Performed: * No procedures listed *  Patient Location: Mother/Baby  Anesthesia Type:Epidural  Level of Consciousness: awake, alert  and patient cooperative  Airway and Oxygen Therapy: Patient Spontanous Breathing  Post-op Pain: none  Post-op Assessment: Patient's Cardiovascular Status Stable, Respiratory Function Stable, Patent Airway, No signs of Nausea or vomiting, Adequate PO intake, Pain level controlled, No headache, No backache, No residual numbness and No residual motor weakness  Post-op Vital Signs: Reviewed and stable  Complications: No apparent anesthesia complications

## 2014-01-02 NOTE — Discharge Summary (Signed)
Obstetric Discharge Summary Reason for Admission: induction of labor Prenatal Procedures: none Intrapartum Procedures: spontaneous vaginal delivery Postpartum Procedures: none Complications-Operative and Postpartum: none HGB  Date Value Range Status  06/23/2011 12.9  11.6 - 15.9 g/dL Final     Hemoglobin  Date Value Range Status  01/02/2014 10.2* 12.0 - 15.0 g/dL Final     HCT  Date Value Range Status  01/02/2014 30.6* 36.0 - 46.0 % Final  06/23/2011 38.6  34.8 - 46.6 % Final    Physical Exam:  General: alert and cooperative Lochia: appropriate Uterine Fundus: firm   Discharge Diagnoses: Term Pregnancy-delivered  Discharge Information: Date: 01/02/2014 Activity: pelvic rest Diet: routine Medications: Ibuprofen Condition: improved Instructions: refer to practice specific booklet Discharge to: home Follow-up Information   Follow up with Logan Bores, MD In 6 weeks.   Specialty:  Obstetrics and Gynecology   Contact information:   [address], [place] [address] [phone]       Newborn Data: Live born female  Birth Weight: 7 lb 9.2 oz (3436 g) APGAR: 7, 9  Home with mother.  Logan Bores 01/02/2014, 9:15 AM

## 2014-01-02 NOTE — Lactation Note (Signed)
This note was copied from the chart of [name]. Lactation Consultation Note  Patient Name: Sandra Shermika Balthaser HGDJM'E Date: 01/02/2014 Reason for consult: Initial assessment  Experienced breastfeeding mother (second child BF for 10 months).  Infant feeding in football position on left breast when LC walked into room.  LS -10; multiple swallows heard.  Mom has history of PCOS but reports ample milk supply with second child (extra 2 months frozen milk after child weaned at 7 months).  Current infant has BF x10 in 22 hrs, voids-3, stools-1, lots swallowing heard.  Infant has weight check scheduled for tomorrow with peds.  Mom has DEBP at home.  No concerns or questions.  Lactation brochure given to parents and informed of outpatient services if needed and community/ hospital support groups.  Encouraged to call with questions if needed after discharge.     Maternal Data Formula Feeding for Exclusion: No Infant to breast within first hour of birth: Yes Does the patient have breastfeeding experience prior to this delivery?: Yes  Feeding Feeding Type: Breast Fed Length of feed: 20 min  LATCH Score/Interventions Latch: Grasps breast easily, tongue down, lips flanged, rhythmical sucking.  Audible Swallowing: Spontaneous and intermittent  Type of Nipple: Everted at rest and after stimulation  Comfort (Breast/Nipple): Soft / non-tender     Hold (Positioning): No assistance needed to correctly position infant at breast.  LATCH Score: 10  Lactation Tools Discussed/Used     Consult Status Consult Status: Complete    Merlene Laughter 01/02/2014, 1:07 PM

## 2014-01-02 NOTE — Progress Notes (Signed)
Post Partum Day 1 Subjective: no complaints and tolerating PO  Would like early d/c  Objective: Blood pressure 102/69, pulse 66, temperature 98.7 F (37.1 C), temperature source Oral, resp. rate 18, height 5' 4.5" (1.638 m), weight 113.853 kg (251 lb), last menstrual period 03/30/2013, SpO2 96.00%, unknown if currently breastfeeding.  Physical Exam:  General: alert and cooperative Lochia: appropriate Uterine Fundus: firm    Recent Labs  01/01/14 0730 01/02/14 0553  HGB 11.0* 10.2*  HCT 33.1* 30.6*    Assessment/Plan: Discharge home   LOS: 1 day   Armenta Erskin W 01/02/2014, 9:13 AM

## 2014-01-09 ENCOUNTER — Encounter (HOSPITAL_COMMUNITY): Payer: Self-pay

## 2014-01-16 ENCOUNTER — Ambulatory Visit
Admission: RE | Admit: 2014-01-16 | Discharge: 2014-01-16 | Disposition: A | Discharge status: Home / Self Care | Payer: BC Managed Care – PPO | Source: Ambulatory Visit | Attending: Endocrinology | Admitting: Endocrinology

## 2014-01-16 ENCOUNTER — Other Ambulatory Visit (HOSPITAL_COMMUNITY)
Admission: RE | Admit: 2014-01-16 | Discharge: 2014-01-16 | Disposition: A | Discharge status: Home / Self Care | Payer: BC Managed Care – PPO | Source: Ambulatory Visit | Attending: Interventional Radiology | Admitting: Interventional Radiology

## 2014-01-16 DIAGNOSIS — E042 Nontoxic multinodular goiter: Secondary | ICD-10-CM

## 2014-01-16 DIAGNOSIS — E049 Nontoxic goiter, unspecified: Secondary | ICD-10-CM | POA: Insufficient documentation

## 2014-01-21 ENCOUNTER — Encounter: Payer: Self-pay | Admitting: Internal Medicine

## 2014-01-21 DIAGNOSIS — E041 Nontoxic single thyroid nodule: Secondary | ICD-10-CM | POA: Insufficient documentation

## 2014-02-05 ENCOUNTER — Encounter (INDEPENDENT_AMBULATORY_CARE_PROVIDER_SITE_OTHER): Payer: Self-pay | Admitting: Surgery

## 2014-02-05 ENCOUNTER — Ambulatory Visit (INDEPENDENT_AMBULATORY_CARE_PROVIDER_SITE_OTHER): Payer: BC Managed Care – PPO | Admitting: Surgery

## 2014-02-05 VITALS — BP 133/81 | HR 77 | Temp 98.2°F | Resp 18 | Ht 64.0 in | Wt 238.6 lb

## 2014-02-05 DIAGNOSIS — D449 Neoplasm of uncertain behavior of unspecified endocrine gland: Secondary | ICD-10-CM

## 2014-02-05 DIAGNOSIS — D440 Neoplasm of uncertain behavior of thyroid gland: Secondary | ICD-10-CM | POA: Insufficient documentation

## 2014-02-05 DIAGNOSIS — E042 Nontoxic multinodular goiter: Secondary | ICD-10-CM

## 2014-02-05 NOTE — Progress Notes (Signed)
General Surgery [facility]  Chief Complaint  Patient presents with  . New Evaluation    multinodular thyroid goiter - referral from Dr. Jacelyn Pi    HISTORY: Patient is a 33 year old female referred by her endocrinologist for evaluation of thyroid nodule with cytologic atypia. Patient was found on physical examination by her gynecologist to have a thyroid nodule. She subsequently underwent thyroid ultrasound. This showed a normal size right thyroid lobe without nodularity. There was a dominant nodule in the inferior pole of the left thyroid lobe measuring 2.7 cm. There was a dominant nodule in the thyroid isthmus measuring 2.9 cm. Patient subsequently underwent fine-needle aspiration biopsy. Nodule in the isthmus showed changes consistent with nonneoplastic goiter. Nodule in the inferior pole of the left thyroid lobe showed a follicular lesion of undetermined significance with nuclear enlargement, nuclear clearing, and nuclear overlap and grooves. Malignancy could not be excluded.  Patient has no prior history of thyroid disease. She has never been on thyroid medication. She has had no prior head or neck surgery.  Family history is notable for hypothyroidism and the patient's mother. There is no family history of endocrine neoplasm.  Past Medical History  Diagnosis Date  . Abnormal Pap smear of cervix   . Polycystic ovarian syndrome   . HPV (human papilloma virus) infection   . Ovarian cyst, left     S/P excision large cyst  . DVT of lower extremity (deep venous thrombosis)     due to compression from large ovarian cyst  . Iron deficiency     Dr. Beryle Beams  . Obesity     S/P  Gastric Bypass    Current Outpatient Prescriptions  Medication Sig Dispense Refill  . acetaminophen (TYLENOL) 500 MG tablet Take 1,000 mg by mouth daily as needed for headache.      . Cholecalciferol (VITAMIN D-3) 5000 UNITS TABS Take 1 tablet by mouth daily.        . Cyanocobalamin  (VITAMIN B-12) 1000 MCG SUBL Place 1 each under the tongue daily.        . Docosahexaenoic Acid (DHA COMPLETE) 200 MG CAPS Take 1 capsule by mouth daily.        Marland Kitchen ibuprofen (ADVIL,MOTRIN) 600 MG tablet Take 1 tablet (600 mg total) by mouth every 6 (six) hours.  30 tablet  0  . Iron-Vitamin C 100-250 MG TABS Take 1 tablet by mouth daily.        . Multiple Vitamin (MULTIVITAMIN) tablet Take 1 tablet by mouth daily.         No current facility-administered medications for this visit.    No Known Allergies  Family History  Problem Relation Age of Onset  . Hypertension Mother   . Depression Mother   . Osteoporosis Mother   . Thyroid disease Mother     hypo  . Hypertension Father   . COPD Father   . Thyroid disease Father     hypo  . Sleep apnea Father   . Hypertension Maternal Grandmother   . Heart disease Maternal Grandmother   . Diabetes Maternal Grandmother   . Hypertension Maternal Grandfather   . Heart disease Maternal Grandfather   . Diabetes Maternal Grandfather   . Other Daughter     neurofibromatosis    History   Social History  . Marital Status: Married    Spouse Name: N/A    Number of Children: N/A  . Years of Education: N/A   Social History Main Topics  .  Smoking status: Never Smoker   . Smokeless tobacco: Never Used  . Alcohol Use: Yes     Comment: 2 drinks/month  . Drug Use: No  . Sexual Activity: Yes   Other Topics Concern  . None   Social History Narrative  . None    REVIEW OF SYSTEMS - PERTINENT POSITIVES ONLY: Denies compressive symptoms. Denies tremors. Denies palpitation.  EXAM: Filed Vitals:   02/05/14 1556  BP: 133/81  Pulse: 77  Temp: 98.2 F (36.8 C)  Resp: 18    GENERAL: well-developed, well-nourished, no acute distress HEENT: normocephalic; pupils equal and reactive; sclerae clear; dentition good; mucous membranes moist NECK:  Palpable dominant soft nodule in the thyroid isthmus; palpable nodule inferior pole left thyroid  lobe was swallowing; no palpable abnormality in the right thyroid bed; symmetric on extension; no palpable anterior or posterior cervical lymphadenopathy; no supraclavicular masses; no tenderness CHEST: clear to auscultation bilaterally without rales, rhonchi, or wheezes CARDIAC: regular rate and rhythm without significant murmur; peripheral pulses are full EXT:  non-tender without edema; no deformity NEURO: no gross focal deficits; no sign of tremor   LABORATORY RESULTS: See [facility] (CHL-Epic) for most recent results  RADIOLOGY RESULTS: See [facility] (CHL-Epic) for most recent results  IMPRESSION: Left thyroid nodule, 2.9 cm, with cytologic atypia  PLAN: The patient and I reviewed the above findings at length. We reviewed her ultrasound report. We reviewed her pathology reports. We discussed surgical options being thyroid lobectomy for cyst total thyroidectomy. I have recommended left thyroid lobectomy with resection of the isthmus. I would plan to leave the normal right thyroid lobe except for the finding of malignancy. We discussed the risk and benefits of the procedure including risk of injury to recurrent laryngeal nerves and injury to parathyroid glands. We discussed surgical incision to be anticipated. We discussed the hospital stay and the postoperative recovery. We discussed the potential need for lifelong thyroid hormone replacement. We discussed the potential need for radioactive iodine treatment. Patient understands the above issues and wishes to proceed with surgery in the near future. We will make arrangements at a time convenient for the patient.  The risks and benefits of the procedure have been discussed at length with the patient.  The patient understands the proposed procedure, potential alternative treatments, and the course of recovery to be expected.  All of the patient's questions have been answered at this time.  The patient wishes to proceed with  surgery.  [name], MD, [facility]  Primary Care Physician: Kelton Pillar, MD

## 2014-02-05 NOTE — Patient Instructions (Signed)

## 2014-02-06 ENCOUNTER — Telehealth (INDEPENDENT_AMBULATORY_CARE_PROVIDER_SITE_OTHER): Payer: Self-pay | Admitting: Surgery

## 2014-02-06 NOTE — Telephone Encounter (Signed)
02/06/14 I left a message for patient to call me back and set up her surgery. Placed in pending/skm

## 2014-02-11 ENCOUNTER — Encounter (HOSPITAL_COMMUNITY): Payer: Self-pay | Admitting: Pharmacy Technician

## 2014-02-13 ENCOUNTER — Encounter (HOSPITAL_COMMUNITY)
Admission: RE | Admit: 2014-02-13 | Discharge: 2014-02-13 | Disposition: A | Discharge status: Home / Self Care | Payer: BC Managed Care – PPO | Source: Ambulatory Visit | Attending: Surgery | Admitting: Surgery

## 2014-02-13 ENCOUNTER — Encounter (HOSPITAL_COMMUNITY): Payer: Self-pay

## 2014-02-13 ENCOUNTER — Ambulatory Visit (HOSPITAL_COMMUNITY)
Admission: RE | Admit: 2014-02-13 | Discharge: 2014-02-13 | Disposition: A | Discharge status: Home / Self Care | Payer: BC Managed Care – PPO | Source: Ambulatory Visit | Attending: Surgery | Admitting: Surgery

## 2014-02-13 HISTORY — DX: Anemia, unspecified: D64.9

## 2014-02-13 HISTORY — DX: Nontoxic single thyroid nodule: E04.1

## 2014-02-13 LAB — CBC
HCT: 39.4 % (ref 36.0–46.0)
Hemoglobin: 12.9 g/dL (ref 12.0–15.0)
MCH: 29.1 pg (ref 26.0–34.0)
MCHC: 32.7 g/dL (ref 30.0–36.0)
MCV: 88.7 fL (ref 78.0–100.0)
PLATELETS: 278 10*3/uL (ref 150–400)
RBC: 4.44 MIL/uL (ref 3.87–5.11)
RDW: 13.5 % (ref 11.5–15.5)
WBC: 8.2 10*3/uL (ref 4.0–10.5)

## 2014-02-13 LAB — HCG, SERUM, QUALITATIVE: Preg, Serum: NEGATIVE

## 2014-02-13 NOTE — Patient Instructions (Signed)
   YOUR SURGERY IS SCHEDULED AT [facility]  ON: Thursday  3/5  REPORT TO  SHORT STAY CENTER AT:  5:30 AM      PHONE # FOR SHORT STAY IS [phone]  DO NOT EAT OR DRINK ANYTHING AFTER MIDNIGHT THE NIGHT BEFORE YOUR SURGERY.  YOU MAY BRUSH YOUR TEETH, RINSE OUT YOUR MOUTH--BUT NO WATER, NO FOOD, NO CHEWING GUM, NO MINTS, NO CANDIES, NO CHEWING TOBACCO.  PLEASE TAKE THE FOLLOWING MEDICATIONS THE AM OF YOUR SURGERY WITH A FEW SIPS OF WATER:  NO MEDS TO TAKE  IF YOU USE INHALERS--USE YOUR INHALERS THE AM OF YOUR SURGERY AND BRING INHALERS TO [place].   IF YOU ARE DIABETIC:  DO NOT TAKE ANY DIABETIC MEDICATIONS THE AM OF YOUR SURGERY.  IF YOU TAKE INSULIN IN THE EVENINGS--PLEASE ONLY TAKE 1/2 NORMAL EVENING DOSE THE NIGHT BEFORE YOUR SURGERY.  NO INSULIN THE AM OF YOUR SURGERY. IF YOU HAVE SLEEP APNEA AND USE CPAP OR BIPAP--PLEASE BRING THE MASK AND THE TUBING.  DO NOT BRING YOUR MACHINE.  DO NOT BRING VALUABLES, MONEY, CREDIT CARDS.  DO NOT WEAR JEWELRY, MAKE-UP, NAIL POLISH AND NO METAL PINS OR CLIPS IN YOUR HAIR. CONTACT LENS, DENTURES / PARTIALS, GLASSES SHOULD NOT BE WORN TO SURGERY AND IN MOST CASES-HEARING AIDS WILL NEED TO BE REMOVED.  BRING YOUR GLASSES CASE, ANY EQUIPMENT NEEDED FOR YOUR CONTACT LENS. FOR PATIENTS ADMITTED TO THE HOSPITAL--CHECK OUT TIME THE DAY OF DISCHARGE IS 11:00 AM.  ALL INPATIENT ROOMS ARE PRIVATE - WITH BATHROOM, TELEPHONE, TV.  FAILURE TO FOLLOW THESE INSTRUCTIONS MAY RESULT IN THE CANCELLATION OF YOUR SURGERY. PLEASE BE AWARE THAT YOU MAY NEED ADDITIONAL BLOOD DRAWN DAY OF YOUR SURGERY  PATIENT SIGNATURE_________________________________

## 2014-02-13 NOTE — Progress Notes (Signed)
Quick Note:  These results are acceptable for scheduled surgery.  Mayce Noyes M. Hanna Ra, MD, FACS [facility] Office: [phone]   ______ 

## 2014-02-13 NOTE — Pre-Procedure Instructions (Signed)
CXR WAS DONE TODAY - PREOP AT [facility]; EKG NOT NEEDED PREOP PER ANESTHESIOLOGIST'S GUIDELINES.

## 2014-02-14 ENCOUNTER — Encounter (HOSPITAL_COMMUNITY)
Admission: RE | Disposition: A | Discharge status: Home / Self Care | Payer: Self-pay | Source: Ambulatory Visit | Attending: Surgery

## 2014-02-14 ENCOUNTER — Encounter (HOSPITAL_COMMUNITY): Payer: Self-pay | Admitting: *Deleted

## 2014-02-14 ENCOUNTER — Encounter (HOSPITAL_COMMUNITY): Payer: BC Managed Care – PPO | Admitting: Certified Registered Nurse Anesthetist

## 2014-02-14 ENCOUNTER — Ambulatory Visit (HOSPITAL_COMMUNITY): Payer: BC Managed Care – PPO | Admitting: Certified Registered Nurse Anesthetist

## 2014-02-14 ENCOUNTER — Observation Stay (HOSPITAL_COMMUNITY)
Admission: RE | Admit: 2014-02-14 | Discharge: 2014-02-15 | Disposition: A | Discharge status: Home / Self Care | Payer: BC Managed Care – PPO | Source: Ambulatory Visit | Attending: Surgery | Admitting: Surgery

## 2014-02-14 DIAGNOSIS — C73 Malignant neoplasm of thyroid gland: Principal | ICD-10-CM | POA: Insufficient documentation

## 2014-02-14 DIAGNOSIS — E041 Nontoxic single thyroid nodule: Secondary | ICD-10-CM | POA: Diagnosis present

## 2014-02-14 DIAGNOSIS — Z9884 Bariatric surgery status: Secondary | ICD-10-CM | POA: Insufficient documentation

## 2014-02-14 DIAGNOSIS — D649 Anemia, unspecified: Secondary | ICD-10-CM | POA: Insufficient documentation

## 2014-02-14 DIAGNOSIS — Z86718 Personal history of other venous thrombosis and embolism: Secondary | ICD-10-CM | POA: Insufficient documentation

## 2014-02-14 DIAGNOSIS — D440 Neoplasm of uncertain behavior of thyroid gland: Secondary | ICD-10-CM | POA: Diagnosis present

## 2014-02-14 DIAGNOSIS — Z79899 Other long term (current) drug therapy: Secondary | ICD-10-CM | POA: Insufficient documentation

## 2014-02-14 DIAGNOSIS — E042 Nontoxic multinodular goiter: Secondary | ICD-10-CM | POA: Diagnosis present

## 2014-02-14 HISTORY — PX: THYROID LOBECTOMY: SHX420

## 2014-02-14 SURGERY — LOBECTOMY, THYROID
Anesthesia: General | Site: Neck | Laterality: Left

## 2014-02-14 MED ORDER — MIDAZOLAM HCL 2 MG/2ML IJ SOLN
INTRAMUSCULAR | Status: AC
  Filled 2014-02-14: qty 2

## 2014-02-14 MED ORDER — ATROPINE SULFATE 0.4 MG/ML IJ SOLN
INTRAMUSCULAR | Status: AC
  Filled 2014-02-14: qty 2

## 2014-02-14 MED ORDER — FENTANYL CITRATE 0.05 MG/ML IJ SOLN
25.0000 ug | INTRAMUSCULAR | Status: DC | PRN
  Administered 2014-02-14 (×2): 50 ug via INTRAVENOUS

## 2014-02-14 MED ORDER — ONDANSETRON HCL 4 MG/2ML IJ SOLN: 4.0000 mg | Freq: Four times a day (QID) | INTRAMUSCULAR | Status: DC | PRN

## 2014-02-14 MED ORDER — PROMETHAZINE HCL 25 MG/ML IJ SOLN: 6.2500 mg | INTRAMUSCULAR | Status: DC | PRN

## 2014-02-14 MED ORDER — SODIUM CHLORIDE 0.9 % IJ SOLN
INTRAMUSCULAR | Status: AC
  Filled 2014-02-14: qty 10

## 2014-02-14 MED ORDER — GLYCOPYRROLATE 0.2 MG/ML IJ SOLN
INTRAMUSCULAR | Status: DC | PRN
  Administered 2014-02-14: .8 mg via INTRAVENOUS

## 2014-02-14 MED ORDER — MEPERIDINE HCL 50 MG/ML IJ SOLN: 6.2500 mg | INTRAMUSCULAR | Status: DC | PRN

## 2014-02-14 MED ORDER — ONDANSETRON HCL 4 MG/2ML IJ SOLN
INTRAMUSCULAR | Status: DC | PRN
  Administered 2014-02-14: 4 mg via INTRAVENOUS

## 2014-02-14 MED ORDER — HYDROMORPHONE HCL PF 1 MG/ML IJ SOLN
INTRAMUSCULAR | Status: AC
  Filled 2014-02-14: qty 1

## 2014-02-14 MED ORDER — GLYCOPYRROLATE 0.2 MG/ML IJ SOLN
INTRAMUSCULAR | Status: AC
  Filled 2014-02-14: qty 4

## 2014-02-14 MED ORDER — HYDROMORPHONE HCL PF 1 MG/ML IJ SOLN
0.2500 mg | INTRAMUSCULAR | Status: DC | PRN
  Administered 2014-02-14: 0.5 mg via INTRAVENOUS

## 2014-02-14 MED ORDER — PROPOFOL 10 MG/ML IV BOLUS
INTRAVENOUS | Status: AC
  Filled 2014-02-14: qty 20

## 2014-02-14 MED ORDER — FENTANYL CITRATE 0.05 MG/ML IJ SOLN
INTRAMUSCULAR | Status: DC | PRN
  Administered 2014-02-14 (×3): 50 ug via INTRAVENOUS
  Administered 2014-02-14: 100 ug via INTRAVENOUS

## 2014-02-14 MED ORDER — HYDROCODONE-ACETAMINOPHEN 5-325 MG PO TABS
1.0000 | ORAL_TABLET | ORAL | Status: DC | PRN
  Administered 2014-02-14 – 2014-02-15 (×3): 1 via ORAL
  Filled 2014-02-14 (×3): qty 1

## 2014-02-14 MED ORDER — DEXAMETHASONE SODIUM PHOSPHATE 10 MG/ML IJ SOLN
INTRAMUSCULAR | Status: AC
  Filled 2014-02-14: qty 1

## 2014-02-14 MED ORDER — LACTATED RINGERS IV SOLN
INTRAVENOUS | Status: DC
  Administered 2014-02-14: 10:00:00 via INTRAVENOUS

## 2014-02-14 MED ORDER — ONDANSETRON HCL 4 MG/2ML IJ SOLN
INTRAMUSCULAR | Status: AC
  Filled 2014-02-14: qty 2

## 2014-02-14 MED ORDER — HYDROMORPHONE HCL PF 1 MG/ML IJ SOLN
0.2500 mg | INTRAMUSCULAR | Status: DC | PRN
  Administered 2014-02-14 (×5): 0.5 mg via INTRAVENOUS

## 2014-02-14 MED ORDER — LIDOCAINE HCL (CARDIAC) 20 MG/ML IV SOLN
INTRAVENOUS | Status: DC | PRN
  Administered 2014-02-14: 100 mg via INTRAVENOUS

## 2014-02-14 MED ORDER — NEOSTIGMINE METHYLSULFATE 1 MG/ML IJ SOLN
INTRAMUSCULAR | Status: DC | PRN
  Administered 2014-02-14: 5 mg via INTRAVENOUS

## 2014-02-14 MED ORDER — ONDANSETRON HCL 4 MG PO TABS: 4.0000 mg | ORAL_TABLET | Freq: Four times a day (QID) | ORAL | Status: DC | PRN

## 2014-02-14 MED ORDER — EPHEDRINE SULFATE 50 MG/ML IJ SOLN
INTRAMUSCULAR | Status: AC
  Filled 2014-02-14: qty 1

## 2014-02-14 MED ORDER — PHENYLEPHRINE HCL 10 MG/ML IJ SOLN
INTRAMUSCULAR | Status: DC | PRN
  Administered 2014-02-14: 40 ug via INTRAVENOUS

## 2014-02-14 MED ORDER — ROCURONIUM BROMIDE 100 MG/10ML IV SOLN
INTRAVENOUS | Status: AC
  Filled 2014-02-14: qty 1

## 2014-02-14 MED ORDER — ACETAMINOPHEN 325 MG PO TABS: 650.0000 mg | ORAL_TABLET | ORAL | Status: DC | PRN

## 2014-02-14 MED ORDER — CEFAZOLIN SODIUM-DEXTROSE 2-3 GM-% IV SOLR
2.0000 g | INTRAVENOUS | Status: AC
  Administered 2014-02-14: 2 g via INTRAVENOUS

## 2014-02-14 MED ORDER — FENTANYL CITRATE 0.05 MG/ML IJ SOLN
INTRAMUSCULAR | Status: AC
  Filled 2014-02-14: qty 2

## 2014-02-14 MED ORDER — ACETAMINOPHEN 10 MG/ML IV SOLN
1000.0000 mg | Freq: Once | INTRAVENOUS | Status: AC
  Administered 2014-02-14: 1000 mg via INTRAVENOUS
  Filled 2014-02-14: qty 100

## 2014-02-14 MED ORDER — LIDOCAINE HCL (CARDIAC) 20 MG/ML IV SOLN
INTRAVENOUS | Status: AC
  Filled 2014-02-14: qty 5

## 2014-02-14 MED ORDER — PROPOFOL 10 MG/ML IV BOLUS
INTRAVENOUS | Status: DC | PRN
  Administered 2014-02-14: 250 mg via INTRAVENOUS

## 2014-02-14 MED ORDER — LACTATED RINGERS IV SOLN
INTRAVENOUS | Status: DC | PRN
  Administered 2014-02-14: 07:00:00 via INTRAVENOUS

## 2014-02-14 MED ORDER — ROCURONIUM BROMIDE 100 MG/10ML IV SOLN
INTRAVENOUS | Status: DC | PRN
  Administered 2014-02-14: 50 mg via INTRAVENOUS

## 2014-02-14 MED ORDER — MIDAZOLAM HCL 5 MG/5ML IJ SOLN
INTRAMUSCULAR | Status: DC | PRN
  Administered 2014-02-14: 2 mg via INTRAVENOUS

## 2014-02-14 MED ORDER — NEOSTIGMINE METHYLSULFATE 1 MG/ML IJ SOLN
INTRAMUSCULAR | Status: AC
  Filled 2014-02-14: qty 10

## 2014-02-14 MED ORDER — FENTANYL CITRATE 0.05 MG/ML IJ SOLN
INTRAMUSCULAR | Status: AC
  Filled 2014-02-14: qty 5

## 2014-02-14 MED ORDER — DEXAMETHASONE SODIUM PHOSPHATE 10 MG/ML IJ SOLN
INTRAMUSCULAR | Status: DC | PRN
  Administered 2014-02-14: 10 mg via INTRAVENOUS

## 2014-02-14 MED ORDER — KCL IN DEXTROSE-NACL 20-5-0.45 MEQ/L-%-% IV SOLN
INTRAVENOUS | Status: DC
  Administered 2014-02-14: 16:00:00 via INTRAVENOUS
  Filled 2014-02-14 (×2): qty 1000

## 2014-02-14 MED ORDER — CEFAZOLIN SODIUM-DEXTROSE 2-3 GM-% IV SOLR
INTRAVENOUS | Status: AC
  Filled 2014-02-14: qty 50

## 2014-02-14 MED ORDER — HYDROMORPHONE HCL PF 1 MG/ML IJ SOLN
1.0000 mg | INTRAMUSCULAR | Status: DC | PRN
  Administered 2014-02-14 (×2): 1 mg via INTRAVENOUS
  Filled 2014-02-14 (×2): qty 1

## 2014-02-14 SURGICAL SUPPLY — 37 items
APL SKNCLS STERI-STRIP NONHPOA (GAUZE/BANDAGES/DRESSINGS) ×1
ATTRACTOMAT 16X20 MAGNETIC DRP (DRAPES) ×2 IMPLANT
BENZOIN TINCTURE PRP APPL 2/3 (GAUZE/BANDAGES/DRESSINGS) ×2 IMPLANT
BLADE HEX COATED 2.75 (ELECTRODE) ×2 IMPLANT
BLADE SURG 15 STRL LF DISP TIS (BLADE) ×1 IMPLANT
BLADE SURG 15 STRL SS (BLADE) ×2
CANISTER SUCTION 2500CC (MISCELLANEOUS) ×2 IMPLANT
CHLORAPREP W/TINT 10.5 ML (MISCELLANEOUS) ×2 IMPLANT
CLIP TI MEDIUM 6 (CLIP) ×4 IMPLANT
CLIP TI WIDE RED SMALL 6 (CLIP) ×4 IMPLANT
DISSECTOR ROUND CHERRY 3/8 STR (MISCELLANEOUS) IMPLANT
DRAPE PED LAPAROTOMY (DRAPES) ×2 IMPLANT
DRESSING SURGICEL FIBRLLR 1X2 (HEMOSTASIS) ×1 IMPLANT
DRSG SURGICEL FIBRILLAR 1X2 (HEMOSTASIS) ×2
ELECT REM PT RETURN 9FT ADLT (ELECTROSURGICAL) ×2
ELECTRODE REM PT RTRN 9FT ADLT (ELECTROSURGICAL) ×1 IMPLANT
GAUZE SPONGE 4X4 16PLY XRAY LF (GAUZE/BANDAGES/DRESSINGS) ×2 IMPLANT
GLOVE SURG ORTHO 8.0 STRL STRW (GLOVE) ×2 IMPLANT
GOWN STRL REUS W/TWL LRG LVL3 (GOWN DISPOSABLE) ×1 IMPLANT
GOWN STRL REUS W/TWL XL LVL3 (GOWN DISPOSABLE) ×5 IMPLANT
KIT BASIN OR (CUSTOM PROCEDURE TRAY) ×2 IMPLANT
NS IRRIG 1000ML POUR BTL (IV SOLUTION) ×2 IMPLANT
PACK BASIC VI WITH GOWN DISP (CUSTOM PROCEDURE TRAY) ×2 IMPLANT
PENCIL BUTTON HOLSTER BLD 10FT (ELECTRODE) ×2 IMPLANT
SHEARS HARMONIC 9CM CVD (BLADE) ×2 IMPLANT
SPONGE GAUZE 4X4 12PLY (GAUZE/BANDAGES/DRESSINGS) IMPLANT
STAPLER VISISTAT 35W (STAPLE) ×2 IMPLANT
STRIP CLOSURE SKIN 1/2X4 (GAUZE/BANDAGES/DRESSINGS) ×2 IMPLANT
SUT MNCRL AB 4-0 PS2 18 (SUTURE) ×2 IMPLANT
SUT SILK 2 0 (SUTURE) ×2
SUT SILK 2-0 18XBRD TIE 12 (SUTURE) ×1 IMPLANT
SUT SILK 3 0 (SUTURE)
SUT SILK 3-0 18XBRD TIE 12 (SUTURE) IMPLANT
SUT VIC AB 3-0 SH 18 (SUTURE) ×2 IMPLANT
SYR BULB IRRIGATION 50ML (SYRINGE) ×2 IMPLANT
TOWEL OR 17X26 10 PK STRL BLUE (TOWEL DISPOSABLE) ×2 IMPLANT
YANKAUER SUCT BULB TIP 10FT TU (MISCELLANEOUS) ×2 IMPLANT

## 2014-02-14 NOTE — Preoperative (Signed)
Beta Blockers   Reason not to administer Beta Blockers:Not Applicable 

## 2014-02-14 NOTE — Transfer of Care (Signed)
Immediate Anesthesia Transfer of Care Note  Patient: [name]  Procedure(s) Performed: Procedure(s): LEFT THYROID LOBECTOMY (Left)  Patient Location: PACU  Anesthesia Type:General  Level of Consciousness: awake, alert , oriented and patient cooperative  Airway & Oxygen Therapy: Patient Spontanous Breathing and Patient connected to face mask oxygen  Post-op Assessment: Report given to PACU RN, Post -op Vital signs reviewed and stable and Patient moving all extremities  Post vital signs: Reviewed and stable  Complications: No apparent anesthesia complications

## 2014-02-14 NOTE — Brief Op Note (Signed)
02/14/2014  8:55 AM  PATIENT:  [name]  33 y.o. female  PRE-OPERATIVE DIAGNOSIS:  thyroid nodules with atypia  POST-OPERATIVE DIAGNOSIS:  same  PROCEDURE:  Procedure(s): LEFT THYROID LOBECTOMY (Left)  SURGEON:  Surgeon(s) and Role:    * [name], MD - Primary  ANESTHESIA:   general  EBL:  Total I/O In: 1000 [I.V.:1000] Out: 10 [Blood:10]  BLOOD ADMINISTERED:none  DRAINS: none   LOCAL MEDICATIONS USED:  NONE  SPECIMEN:  Excision  DISPOSITION OF SPECIMEN:  PATHOLOGY  COUNTS:  YES  TOURNIQUET:  * No tourniquets in log *  DICTATION: .Other Dictation: Dictation Number [id number]  PLAN OF CARE: Admit for overnight observation  PATIENT DISPOSITION:  PACU - hemodynamically stable.   Delay start of Pharmacological VTE agent (>24hrs) due to surgical blood loss or risk of bleeding: yes  [name], MD, [facility] Office: [phone]

## 2014-02-14 NOTE — Anesthesia Postprocedure Evaluation (Signed)
  Anesthesia Post-op Note  Patient: [name]  Procedure(s) Performed: Procedure(s) (LRB): LEFT THYROID LOBECTOMY (Left)  Patient Location: PACU  Anesthesia Type: General  Level of Consciousness: awake and alert   Airway and Oxygen Therapy: Patient Spontanous Breathing  Post-op Pain: mild  Post-op Assessment: Post-op Vital signs reviewed, Patient's Cardiovascular Status Stable, Respiratory Function Stable, Patent Airway and No signs of Nausea or vomiting  Last Vitals:  Filed Vitals:   02/14/14 1145  BP: 108/63  Pulse: 50  Temp: 36.7 C  Resp: 12    Post-op Vital Signs: stable   Complications: No apparent anesthesia complications

## 2014-02-14 NOTE — Anesthesia Preprocedure Evaluation (Addendum)
Anesthesia Evaluation  Patient identified by MRN, date of birth, ID band Patient awake    Reviewed: Allergy & Precautions, H&P , NPO status , Patient's Chart, lab work & pertinent test results  Airway Mallampati: II TM Distance: >3 FB Neck ROM: Full    Dental no notable dental hx.    Pulmonary neg pulmonary ROS,  breath sounds clear to auscultation  Pulmonary exam normal       Cardiovascular + Peripheral Vascular Disease negative cardio ROS  Rhythm:Regular Rate:Normal     Neuro/Psych negative neurological ROS  negative psych ROS   GI/Hepatic negative GI ROS, Neg liver ROS,   Endo/Other  negative endocrine ROS  Renal/GU negative Renal ROS  negative genitourinary   Musculoskeletal negative musculoskeletal ROS (+)   Abdominal   Peds negative pediatric ROS (+)  Hematology negative hematology ROS (+) anemia ,   Anesthesia Other Findings   Reproductive/Obstetrics negative OB ROS                          Anesthesia Physical Anesthesia Plan  ASA: II  Anesthesia Plan: General   Post-op Pain Management:    Induction: Intravenous  Airway Management Planned: Oral ETT  Additional Equipment:   Intra-op Plan:   Post-operative Plan: Extubation in OR  Informed Consent: I have reviewed the patients History and Physical, chart, labs and discussed the procedure including the risks, benefits and alternatives for the proposed anesthesia with the patient or authorized representative who has indicated his/her understanding and acceptance.   Dental advisory given  Plan Discussed with: CRNA  Anesthesia Plan Comments:         Anesthesia Quick Evaluation

## 2014-02-14 NOTE — H&P (View-Only) (Signed)
General Surgery [facility]  Chief Complaint  Patient presents with  . New Evaluation    multinodular thyroid goiter - referral from Dr. Jacelyn Pi    HISTORY: Patient is a 33 year old female referred by her endocrinologist for evaluation of thyroid nodule with cytologic atypia. Patient was found on physical examination by her gynecologist to have a thyroid nodule. She subsequently underwent thyroid ultrasound. This showed a normal size right thyroid lobe without nodularity. There was a dominant nodule in the inferior pole of the left thyroid lobe measuring 2.7 cm. There was a dominant nodule in the thyroid isthmus measuring 2.9 cm. Patient subsequently underwent fine-needle aspiration biopsy. Nodule in the isthmus showed changes consistent with nonneoplastic goiter. Nodule in the inferior pole of the left thyroid lobe showed a follicular lesion of undetermined significance with nuclear enlargement, nuclear clearing, and nuclear overlap and grooves. Malignancy could not be excluded.  Patient has no prior history of thyroid disease. She has never been on thyroid medication. She has had no prior head or neck surgery.  Family history is notable for hypothyroidism and the patient's mother. There is no family history of endocrine neoplasm.  Past Medical History  Diagnosis Date  . Abnormal Pap smear of cervix   . Polycystic ovarian syndrome   . HPV (human papilloma virus) infection   . Ovarian cyst, left     S/P excision large cyst  . DVT of lower extremity (deep venous thrombosis)     due to compression from large ovarian cyst  . Iron deficiency     Dr. Beryle Beams  . Obesity     S/P  Gastric Bypass    Current Outpatient Prescriptions  Medication Sig Dispense Refill  . acetaminophen (TYLENOL) 500 MG tablet Take 1,000 mg by mouth daily as needed for headache.      . Cholecalciferol (VITAMIN D-3) 5000 UNITS TABS Take 1 tablet by mouth daily.        . Cyanocobalamin  (VITAMIN B-12) 1000 MCG SUBL Place 1 each under the tongue daily.        . Docosahexaenoic Acid (DHA COMPLETE) 200 MG CAPS Take 1 capsule by mouth daily.        Marland Kitchen ibuprofen (ADVIL,MOTRIN) 600 MG tablet Take 1 tablet (600 mg total) by mouth every 6 (six) hours.  30 tablet  0  . Iron-Vitamin C 100-250 MG TABS Take 1 tablet by mouth daily.        . Multiple Vitamin (MULTIVITAMIN) tablet Take 1 tablet by mouth daily.         No current facility-administered medications for this visit.    No Known Allergies  Family History  Problem Relation Age of Onset  . Hypertension Mother   . Depression Mother   . Osteoporosis Mother   . Thyroid disease Mother     hypo  . Hypertension Father   . COPD Father   . Thyroid disease Father     hypo  . Sleep apnea Father   . Hypertension Maternal Grandmother   . Heart disease Maternal Grandmother   . Diabetes Maternal Grandmother   . Hypertension Maternal Grandfather   . Heart disease Maternal Grandfather   . Diabetes Maternal Grandfather   . Other Daughter     neurofibromatosis    History   Social History  . Marital Status: Married    Spouse Name: N/A    Number of Children: N/A  . Years of Education: N/A   Social History Main Topics  .  Smoking status: Never Smoker   . Smokeless tobacco: Never Used  . Alcohol Use: Yes     Comment: 2 drinks/month  . Drug Use: No  . Sexual Activity: Yes   Other Topics Concern  . None   Social History Narrative  . None    REVIEW OF SYSTEMS - PERTINENT POSITIVES ONLY: Denies compressive symptoms. Denies tremors. Denies palpitation.  EXAM: Filed Vitals:   02/05/14 1556  BP: 133/81  Pulse: 77  Temp: 98.2 F (36.8 C)  Resp: 18    GENERAL: well-developed, well-nourished, no acute distress HEENT: normocephalic; pupils equal and reactive; sclerae clear; dentition good; mucous membranes moist NECK:  Palpable dominant soft nodule in the thyroid isthmus; palpable nodule inferior pole left thyroid  lobe was swallowing; no palpable abnormality in the right thyroid bed; symmetric on extension; no palpable anterior or posterior cervical lymphadenopathy; no supraclavicular masses; no tenderness CHEST: clear to auscultation bilaterally without rales, rhonchi, or wheezes CARDIAC: regular rate and rhythm without significant murmur; peripheral pulses are full EXT:  non-tender without edema; no deformity NEURO: no gross focal deficits; no sign of tremor   LABORATORY RESULTS: See [facility] (CHL-Epic) for most recent results  RADIOLOGY RESULTS: See [facility] (CHL-Epic) for most recent results  IMPRESSION: Left thyroid nodule, 2.9 cm, with cytologic atypia  PLAN: The patient and I reviewed the above findings at length. We reviewed her ultrasound report. We reviewed her pathology reports. We discussed surgical options being thyroid lobectomy for cyst total thyroidectomy. I have recommended left thyroid lobectomy with resection of the isthmus. I would plan to leave the normal right thyroid lobe except for the finding of malignancy. We discussed the risk and benefits of the procedure including risk of injury to recurrent laryngeal nerves and injury to parathyroid glands. We discussed surgical incision to be anticipated. We discussed the hospital stay and the postoperative recovery. We discussed the potential need for lifelong thyroid hormone replacement. We discussed the potential need for radioactive iodine treatment. Patient understands the above issues and wishes to proceed with surgery in the near future. We will make arrangements at a time convenient for the patient.  The risks and benefits of the procedure have been discussed at length with the patient.  The patient understands the proposed procedure, potential alternative treatments, and the course of recovery to be expected.  All of the patient's questions have been answered at this time.  The patient wishes to proceed with  surgery.  Jarmon Javid M. Normal Recinos, MD, FACS General & Endocrine Surgery [facility]  Primary Care Physician: SCHOENHOFF,DEBBIE, MD   

## 2014-02-14 NOTE — Interval H&P Note (Signed)
History and Physical Interval Note:  02/14/2014 7:15 AM  [name]  has presented today for surgery, with the diagnosis of thyroid nodule.  The various methods of treatment have been discussed with the patient and family. After consideration of risks, benefits and other options for treatment, the patient has consented to    Procedure(s): LEFT THYROID LOBECTOMY (Left) as a surgical intervention .    The patient's history has been reviewed, patient examined, no change in status, stable for surgery.  I have reviewed the patient's chart and labs.  Questions were answered to the patient's satisfaction.    [name], MD, [facility] Office: [place]

## 2014-02-15 ENCOUNTER — Encounter (HOSPITAL_COMMUNITY): Payer: Self-pay | Admitting: Surgery

## 2014-02-15 MED ORDER — HYDROCODONE-ACETAMINOPHEN 5-325 MG PO TABS: 1.0000 | ORAL_TABLET | ORAL | Status: DC | PRN

## 2014-02-15 NOTE — Op Note (Signed)
[name], [name]             ACCOUNT NO.:  [id number]  MEDICAL RECORD NO.:  [id number]  LOCATION:  23                         FACILITY:  [facility]  PHYSICIAN:  [name], MD      DATE OF BIRTH:  Jun 14, 1981  DATE OF PROCEDURE:  02/14/2014                              OPERATIVE REPORT   PREOPERATIVE DIAGNOSIS:  Multiple thyroid nodules with cytologic atypia.  POSTOPERATIVE DIAGNOSIS:  Multiple thyroid nodules with cytologic atypia.  PROCEDURE:  Left thyroid lobectomy and isthmusectomy.  SURGEON:  [name], MD, FACS  ANESTHESIA:  General.  ESTIMATED BLOOD LOSS:  Minimal.  PREPARATION:  ChloraPrep.  COMPLICATIONS:  None.  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  The patient had been found on physical examination to have thyroid nodules.  Ultrasound demonstrated a dominant nodule in the inferior pole of the left thyroid lobe measuring 2.7 cm.  There was also a dominant nodule in the thyroid isthmus measuring 2.9 cm.  Fine needle aspiration of the nodule in the left thyroid lobe showed nuclear enlargement, nuclear clearing, and nuclear overlap with grooves. Malignancy could not be excluded.  The patient was referred for resection for definitive diagnosis.  BODY OF REPORT:  Procedures done in OR #6 at the [facility].  The patient was brought to the operating room, placed in a supine position on the operating room table.  Following administration of general anesthesia, the patient was positioned and then prepped and draped in the usual aseptic fashion.  After ascertaining that an adequate level of anesthesia had been achieved, a Kocher incision was made with a #15 blade.  Dissection was carried through subcutaneous tissues and hemostasis achieved with the electrocautery.  Platysma was divided with electrocautery.  Skin flaps were  elevated cephalad and caudad from the thyroid notch to the sternal notch.  A Mahorner self- retaining retractor was placed for exposure.  Strap muscles were incised in the midline and reflected to the left exposing an enlarged left thyroid lobe containing multiple nodules.  Middle thyroid vein was divided between Ligaclips with the Harmonic scalpel.  Superior pole was dissected out and superior pole vessels divided individually between small and medium Ligaclips with the Harmonic scalpel.  Inferior venous tributaries were divided between Ligaclips.  Superior and inferior parathyroid tissue was identified and preserved.  Gland was rolled anteriorly.  Branches of the inferior thyroid artery are divided between small Ligaclips.  Recurrent nerve was identified and preserved. Ligament of Gwenlyn Found was released with the electrocautery and the gland was mobilized onto the anterior trachea.  Isthmus was mobilized across the midline.  It contains a dominant nodule.  There is a moderate size pyramidal lobe, which was dissected off of the anterior thyroid cartilage and resected with the isthmus.  Thyroid tissue was divided with the Harmonic scalpel at the junction of the isthmus and right thyroid lobe.  Suture was used to mark the left superior pole.  The entire left lobe and isthmus was submitted to Pathology for review.  The neck was irrigated with warm saline.  Good hemostasis was noted. Fibrillar was placed  throughout the operative field.  Right lobe was palpated and there are no dominant or discrete masses palpable.  There was no lymphadenopathy.  Strap muscles were closed in the midline with interrupted 3-0 Vicryl sutures.  Platysma was closed with interrupted 3- 0 Vicryl sutures.  Skin was closed with a running 4-0 Monocryl subcuticular suture.  Wound was washed and dried and benzoin and Steri- Strips were applied.  Sterile dressings were applied.  The patient was awakened from anesthesia and  brought to the recovery room.  The patient tolerated the procedure well.   [name], MD, [facility] Office: [phone]   TMG/MEDQ  D:  02/14/2014  T:  02/15/2014  Job:  [id number]  cc:   Jacelyn Pi, M.D. Fax: [phone]  Lanice Shirts, M.D.

## 2014-02-15 NOTE — Discharge Summary (Signed)
Physician Discharge Summary [facility]  Patient ID: [name] MRN: [id number] DOB/AGE: 1981/05/09 33 y.o.  Admit date: 02/14/2014 Discharge date: 02/15/2014  Admission Diagnoses:  Thyroid nodules with atypia  Discharge Diagnoses:  Principal Problem:   Neoplasm of uncertain behavior of thyroid gland, left lobe Active Problems:   Multiple thyroid nodules   Left thyroid nodule   Thyroid nodule   Discharged Condition: good  Hospital Course: Patient was admitted for observation following thyroid surgery.  Post op course was uncomplicated.  Pain was well controlled.  Tolerated diet.  Patient was prepared for discharge home on POD#1.  Consults: None  Significant Diagnostic Studies: none  Treatments: surgery: left thyroid lobectomy and isthmusectomy  Discharge Exam: Blood pressure 116/77, pulse 82, temperature 98.5 F (36.9 C), temperature source Oral, resp. rate 18, height 5\' 4"  (1.626 m), weight 240 lb 13.6 oz (109.25 kg), SpO2 95.00%, currently breastfeeding. HEENT - clear Neck - soft, incision clear and dry and intact; voice normal; minimal STS Chest - clear bilaterally Cor - RRR  Disposition: Home with family  Discharge Orders   Future Appointments Provider Department Dept Phone   04/17/2014 3:00 PM Lanice Shirts, MD [facility] [phone]   Future Orders Complete By Expires   Apply dressing  As directed    Scheduling Instructions:     Apply light gauze dressing to wound before discharge home today.   Diet - low sodium heart healthy  As directed    Discharge instructions  As directed    Comments:     THYROID & PARATHYROID SURGERY - POST OP INSTRUCTIONS  Always review your discharge instruction sheet from the facility where your surgery was performed.  A prescription for pain medication may be given to you upon discharge.  Take your pain medication as prescribed.  If narcotic pain medicine is not needed, then you  may take acetaminophen (Tylenol) or ibuprofen (Advil) as needed.  Take your usually prescribed medications unless otherwise directed.  If you need a refill on your pain medication, please contact your pharmacy. They will contact our office to request authorization.  Prescriptions will not be processed after 5 pm or on weekends.  Start with a light diet upon arrival home, such as soup and crackers or toast.  Be sure to drink plenty of fluids daily.  Resume your normal diet the day after surgery.  Most patients will experience some swelling and bruising on the chest and neck area.  Ice packs will help.  Swelling and bruising can take several days to resolve.   It is common to experience some constipation if taking pain medication after surgery.  Increasing fluid intake and taking a stool softener will usually help or prevent this problem.  A mild laxative (Milk of Magnesia or Miralax) should be taken according to package directions if there are no bowel movements after 48 hours.  You may remove your bandages 24-48 hours after surgery, and you may shower at that time.  You have steri-strips (small skin tapes) in place directly over the incision.  These strips should be left on the skin for 7-10 days and then removed.  You may resume regular (light) daily activities beginning the next day-such as daily self-care, walking, climbing stairs-gradually increasing activities as tolerated.  You may have sexual intercourse when it is comfortable.  Refrain from any heavy lifting or straining until approved by your doctor.  You may drive when you no longer are taking prescription pain  medication, you can comfortably wear a seatbelt, and you can safely maneuver your car and apply brakes.  You should see your doctor in the office for a follow-up appointment approximately two to three weeks after your surgery.  Make sure that you call for this appointment within a day or two after you arrive home to insure a  convenient appointment time.  WHEN TO CALL YOUR DOCTOR: -- Fever greater than 101.5 -- Inability to urinate -- Nausea and/or vomiting - persistent -- Extreme swelling or bruising -- Continued bleeding from incision -- Increased pain, redness, or drainage from the incision -- Difficulty swallowing or breathing -- Muscle cramping or spasms -- Numbness or tingling in hands or around lips  The clinic staff is available to answer your questions during regular business hours.  Please don't hesitate to call and ask to speak to one of the nurses if you have concerns.  [name], MD, [facility] Office: [phone]   Increase activity slowly  As directed    Remove dressing in 24 hours  As directed        Medication List         acetaminophen 500 MG tablet  Commonly known as:  TYLENOL  Take 1,000 mg by mouth daily as needed for headache.     DHA COMPLETE 200 MG Caps  Generic drug:  Docosahexaenoic Acid  Take 1 capsule by mouth daily.     HYDROcodone-acetaminophen 5-325 MG per tablet  Commonly known as:  NORCO/VICODIN  Take 1-2 tablets by mouth every 4 (four) hours as needed for moderate pain.     ibuprofen 600 MG tablet  Commonly known as:  ADVIL,MOTRIN  Take 1 tablet (600 mg total) by mouth every 6 (six) hours.     Iron-Vitamin C 100-250 MG Tabs  Take 1 tablet by mouth daily.     multivitamin with minerals Tabs tablet  Take 1 tablet by mouth daily.     Vitamin B-12 1000 MCG Subl  Place 1 each under the tongue daily.     Vitamin D 2000 UNITS tablet  Take 2,000 Units by mouth daily.           Follow-up Information   Follow up with [name], MD. Schedule an appointment as soon as possible for a visit in 2 weeks.   Specialty:  General Surgery   Contact information:   [address] [place] [address] [phone]       [name], MD, [facility] Office:  [phone]   Signed: [name] 02/15/2014, 8:48 AM

## 2014-02-18 ENCOUNTER — Telehealth (INDEPENDENT_AMBULATORY_CARE_PROVIDER_SITE_OTHER): Payer: Self-pay | Admitting: General Surgery

## 2014-02-18 NOTE — Telephone Encounter (Signed)
Pt called for pathology results from surgery last week.  Explained they were not yet finalized in [place], but then Dr. Harlow Asa will need to review and release them to be reported to her.  She understands and will wait for that call.

## 2014-02-19 ENCOUNTER — Other Ambulatory Visit (INDEPENDENT_AMBULATORY_CARE_PROVIDER_SITE_OTHER): Payer: Self-pay | Admitting: Surgery

## 2014-02-19 ENCOUNTER — Encounter: Payer: Self-pay | Admitting: Internal Medicine

## 2014-02-19 ENCOUNTER — Telehealth (INDEPENDENT_AMBULATORY_CARE_PROVIDER_SITE_OTHER): Payer: Self-pay | Admitting: Surgery

## 2014-02-19 ENCOUNTER — Telehealth (INDEPENDENT_AMBULATORY_CARE_PROVIDER_SITE_OTHER): Payer: Self-pay

## 2014-02-19 DIAGNOSIS — C73 Malignant neoplasm of thyroid gland: Secondary | ICD-10-CM | POA: Insufficient documentation

## 2014-02-19 NOTE — Progress Notes (Signed)
Quick Note:  Received final pathology results this morning. Discussed on telephone with Dr. Claudette Laws. Results now available on Epic.  Telephone call to patient with pathology results. She will require completion thyroidectomy in the immediate future. We will make arrangements for surgery as soon as possible.  [name], MD, [facility] Office: [phone]    ______

## 2014-02-19 NOTE — Telephone Encounter (Signed)
Received final pathology results this morning. Discussed on telephone with Dr. Claudette Laws. Results now available on Epic.  Telephone call to patient with pathology results. She will require completion thyroidectomy in the immediate future. We will make arrangements for surgery as soon as possible.  [name], MD, [facility] Office: [phone]

## 2014-02-19 NOTE — Telephone Encounter (Signed)
Surgery scheduling form given to [place] in sched dept.

## 2014-02-20 ENCOUNTER — Encounter (HOSPITAL_COMMUNITY): Payer: Self-pay | Admitting: *Deleted

## 2014-02-21 ENCOUNTER — Encounter (HOSPITAL_COMMUNITY): Payer: BC Managed Care – PPO | Admitting: Certified Registered Nurse Anesthetist

## 2014-02-21 ENCOUNTER — Ambulatory Visit (HOSPITAL_COMMUNITY): Payer: BC Managed Care – PPO | Admitting: Certified Registered Nurse Anesthetist

## 2014-02-21 ENCOUNTER — Encounter (HOSPITAL_COMMUNITY)
Admission: RE | Disposition: A | Discharge status: Home / Self Care | Payer: Self-pay | Source: Ambulatory Visit | Attending: Surgery

## 2014-02-21 ENCOUNTER — Encounter (HOSPITAL_COMMUNITY): Payer: Self-pay | Admitting: *Deleted

## 2014-02-21 ENCOUNTER — Observation Stay (HOSPITAL_COMMUNITY)
Admission: RE | Admit: 2014-02-21 | Discharge: 2014-02-22 | Disposition: A | Discharge status: Home / Self Care | Payer: BC Managed Care – PPO | Source: Ambulatory Visit | Attending: Surgery | Admitting: Surgery

## 2014-02-21 DIAGNOSIS — E282 Polycystic ovarian syndrome: Secondary | ICD-10-CM | POA: Insufficient documentation

## 2014-02-21 DIAGNOSIS — Z79899 Other long term (current) drug therapy: Secondary | ICD-10-CM | POA: Insufficient documentation

## 2014-02-21 DIAGNOSIS — Z86718 Personal history of other venous thrombosis and embolism: Secondary | ICD-10-CM | POA: Insufficient documentation

## 2014-02-21 DIAGNOSIS — C73 Malignant neoplasm of thyroid gland: Secondary | ICD-10-CM

## 2014-02-21 DIAGNOSIS — D509 Iron deficiency anemia, unspecified: Secondary | ICD-10-CM | POA: Insufficient documentation

## 2014-02-21 DIAGNOSIS — E042 Nontoxic multinodular goiter: Secondary | ICD-10-CM | POA: Insufficient documentation

## 2014-02-21 DIAGNOSIS — I739 Peripheral vascular disease, unspecified: Secondary | ICD-10-CM | POA: Insufficient documentation

## 2014-02-21 DIAGNOSIS — E669 Obesity, unspecified: Secondary | ICD-10-CM | POA: Insufficient documentation

## 2014-02-21 HISTORY — PX: THYROIDECTOMY: SHX17

## 2014-02-21 SURGERY — THYROIDECTOMY
Anesthesia: General | Site: Neck

## 2014-02-21 MED ORDER — SUCCINYLCHOLINE CHLORIDE 20 MG/ML IJ SOLN
INTRAMUSCULAR | Status: DC | PRN
  Administered 2014-02-21: 100 mg via INTRAVENOUS

## 2014-02-21 MED ORDER — HYDROMORPHONE HCL PF 1 MG/ML IJ SOLN
INTRAMUSCULAR | Status: AC
  Filled 2014-02-21: qty 1

## 2014-02-21 MED ORDER — FENTANYL CITRATE 0.05 MG/ML IJ SOLN
INTRAMUSCULAR | Status: AC
  Filled 2014-02-21: qty 5

## 2014-02-21 MED ORDER — FENTANYL CITRATE 0.05 MG/ML IJ SOLN
INTRAMUSCULAR | Status: AC
  Filled 2014-02-21: qty 2

## 2014-02-21 MED ORDER — KCL IN DEXTROSE-NACL 20-5-0.45 MEQ/L-%-% IV SOLN
INTRAVENOUS | Status: DC
  Administered 2014-02-21: 23:00:00 via INTRAVENOUS
  Filled 2014-02-21 (×2): qty 1000

## 2014-02-21 MED ORDER — FENTANYL CITRATE 0.05 MG/ML IJ SOLN
25.0000 ug | INTRAMUSCULAR | Status: DC | PRN
  Administered 2014-02-21 (×2): 50 ug via INTRAVENOUS
  Administered 2014-02-21 (×2): 25 ug via INTRAVENOUS

## 2014-02-21 MED ORDER — HYDROMORPHONE HCL PF 1 MG/ML IJ SOLN: 1.0000 mg | INTRAMUSCULAR | Status: DC | PRN

## 2014-02-21 MED ORDER — NEOSTIGMINE METHYLSULFATE 1 MG/ML IJ SOLN
INTRAMUSCULAR | Status: AC
  Filled 2014-02-21: qty 10

## 2014-02-21 MED ORDER — FENTANYL CITRATE 0.05 MG/ML IJ SOLN
INTRAMUSCULAR | Status: DC | PRN
  Administered 2014-02-21: 50 ug via INTRAVENOUS
  Administered 2014-02-21: 100 ug via INTRAVENOUS
  Administered 2014-02-21 (×4): 50 ug via INTRAVENOUS

## 2014-02-21 MED ORDER — DEXAMETHASONE SODIUM PHOSPHATE 10 MG/ML IJ SOLN
INTRAMUSCULAR | Status: AC
  Filled 2014-02-21: qty 1

## 2014-02-21 MED ORDER — ACETAMINOPHEN 325 MG PO TABS: 650.0000 mg | ORAL_TABLET | ORAL | Status: DC | PRN

## 2014-02-21 MED ORDER — NEOSTIGMINE METHYLSULFATE 1 MG/ML IJ SOLN
INTRAMUSCULAR | Status: DC | PRN
  Administered 2014-02-21: 5 mg via INTRAVENOUS

## 2014-02-21 MED ORDER — GLYCOPYRROLATE 0.2 MG/ML IJ SOLN
INTRAMUSCULAR | Status: AC
  Filled 2014-02-21: qty 3

## 2014-02-21 MED ORDER — PROPOFOL 10 MG/ML IV BOLUS
INTRAVENOUS | Status: DC | PRN
  Administered 2014-02-21: 180 mg via INTRAVENOUS

## 2014-02-21 MED ORDER — PROMETHAZINE HCL 25 MG/ML IJ SOLN
INTRAMUSCULAR | Status: AC
  Filled 2014-02-21: qty 1

## 2014-02-21 MED ORDER — LACTATED RINGERS IV SOLN
INTRAVENOUS | Status: DC
  Administered 2014-02-21: 1000 mL via INTRAVENOUS

## 2014-02-21 MED ORDER — HYDROMORPHONE HCL PF 1 MG/ML IJ SOLN
0.2500 mg | INTRAMUSCULAR | Status: DC | PRN
  Administered 2014-02-21 (×4): 0.5 mg via INTRAVENOUS

## 2014-02-21 MED ORDER — CEFAZOLIN SODIUM-DEXTROSE 2-3 GM-% IV SOLR
2.0000 g | INTRAVENOUS | Status: AC
  Administered 2014-02-21: 2 g via INTRAVENOUS

## 2014-02-21 MED ORDER — CALCIUM CARBONATE 1250 (500 CA) MG PO TABS
2.0000 | ORAL_TABLET | Freq: Three times a day (TID) | ORAL | Status: DC
Start: 2014-02-22 — End: 2014-02-22
  Administered 2014-02-22: 1000 mg via ORAL
  Filled 2014-02-21 (×4): qty 2

## 2014-02-21 MED ORDER — LIDOCAINE HCL (CARDIAC) 20 MG/ML IV SOLN
INTRAVENOUS | Status: AC
  Filled 2014-02-21: qty 5

## 2014-02-21 MED ORDER — LIDOCAINE HCL (CARDIAC) 20 MG/ML IV SOLN
INTRAVENOUS | Status: DC | PRN
  Administered 2014-02-21: 100 mg via INTRAVENOUS

## 2014-02-21 MED ORDER — MIDAZOLAM HCL 5 MG/5ML IJ SOLN
INTRAMUSCULAR | Status: DC | PRN
  Administered 2014-02-21: 2 mg via INTRAVENOUS

## 2014-02-21 MED ORDER — ONDANSETRON HCL 4 MG/2ML IJ SOLN: 4.0000 mg | Freq: Four times a day (QID) | INTRAMUSCULAR | Status: DC | PRN

## 2014-02-21 MED ORDER — ROCURONIUM BROMIDE 100 MG/10ML IV SOLN
INTRAVENOUS | Status: DC | PRN
  Administered 2014-02-21: 10 mg via INTRAVENOUS
  Administered 2014-02-21: 30 mg via INTRAVENOUS

## 2014-02-21 MED ORDER — HYDROCODONE-ACETAMINOPHEN 5-325 MG PO TABS
1.0000 | ORAL_TABLET | ORAL | Status: DC | PRN
  Administered 2014-02-21: 1 via ORAL
  Filled 2014-02-21: qty 1

## 2014-02-21 MED ORDER — ONDANSETRON HCL 4 MG/2ML IJ SOLN
INTRAMUSCULAR | Status: DC | PRN
  Administered 2014-02-21: 4 mg via INTRAVENOUS

## 2014-02-21 MED ORDER — PROPOFOL 10 MG/ML IV BOLUS
INTRAVENOUS | Status: AC
  Filled 2014-02-21: qty 20

## 2014-02-21 MED ORDER — IBUPROFEN 600 MG PO TABS
600.0000 mg | ORAL_TABLET | Freq: Four times a day (QID) | ORAL | Status: DC
  Administered 2014-02-21 – 2014-02-22 (×3): 600 mg via ORAL
  Filled 2014-02-21 (×7): qty 1

## 2014-02-21 MED ORDER — GLYCOPYRROLATE 0.2 MG/ML IJ SOLN
INTRAMUSCULAR | Status: AC
  Filled 2014-02-21: qty 1

## 2014-02-21 MED ORDER — MIDAZOLAM HCL 2 MG/2ML IJ SOLN
INTRAMUSCULAR | Status: AC
  Filled 2014-02-21: qty 2

## 2014-02-21 MED ORDER — ROCURONIUM BROMIDE 100 MG/10ML IV SOLN
INTRAVENOUS | Status: AC
  Filled 2014-02-21: qty 1

## 2014-02-21 MED ORDER — 0.9 % SODIUM CHLORIDE (POUR BTL) OPTIME
TOPICAL | Status: DC | PRN
  Administered 2014-02-21: 1000 mL

## 2014-02-21 MED ORDER — SODIUM CHLORIDE 0.9 % IJ SOLN
INTRAMUSCULAR | Status: AC
  Filled 2014-02-21: qty 10

## 2014-02-21 MED ORDER — EPHEDRINE SULFATE 50 MG/ML IJ SOLN
INTRAMUSCULAR | Status: AC
  Filled 2014-02-21: qty 1

## 2014-02-21 MED ORDER — DEXAMETHASONE SODIUM PHOSPHATE 10 MG/ML IJ SOLN
INTRAMUSCULAR | Status: DC | PRN
  Administered 2014-02-21: 10 mg via INTRAVENOUS

## 2014-02-21 MED ORDER — PROMETHAZINE HCL 25 MG/ML IJ SOLN
6.2500 mg | INTRAMUSCULAR | Status: DC | PRN
  Administered 2014-02-21: 6.25 mg via INTRAVENOUS

## 2014-02-21 MED ORDER — CEFAZOLIN SODIUM-DEXTROSE 2-3 GM-% IV SOLR
INTRAVENOUS | Status: AC
  Filled 2014-02-21: qty 50

## 2014-02-21 MED ORDER — ONDANSETRON HCL 4 MG/2ML IJ SOLN
INTRAMUSCULAR | Status: AC
  Filled 2014-02-21: qty 2

## 2014-02-21 MED ORDER — ACETAMINOPHEN 10 MG/ML IV SOLN
1000.0000 mg | INTRAVENOUS | Status: AC
  Administered 2014-02-21: 1000 mg via INTRAVENOUS
  Filled 2014-02-21: qty 100

## 2014-02-21 MED ORDER — GLYCOPYRROLATE 0.2 MG/ML IJ SOLN
INTRAMUSCULAR | Status: DC | PRN
  Administered 2014-02-21: 0.6 mg via INTRAVENOUS

## 2014-02-21 MED ORDER — ONDANSETRON HCL 4 MG PO TABS: 4.0000 mg | ORAL_TABLET | Freq: Four times a day (QID) | ORAL | Status: DC | PRN

## 2014-02-21 SURGICAL SUPPLY — 44 items
APL SKNCLS STERI-STRIP NONHPOA (GAUZE/BANDAGES/DRESSINGS) ×1
ATTRACTOMAT 16X20 MAGNETIC DRP (DRAPES) ×2 IMPLANT
BENZOIN TINCTURE PRP APPL 2/3 (GAUZE/BANDAGES/DRESSINGS) ×2 IMPLANT
BLADE HEX COATED 2.75 (ELECTRODE) ×2 IMPLANT
BLADE SURG 15 STRL LF DISP TIS (BLADE) ×1 IMPLANT
BLADE SURG 15 STRL SS (BLADE) ×2
CANISTER SUCTION 2500CC (MISCELLANEOUS) ×2 IMPLANT
CHLORAPREP W/TINT 26ML (MISCELLANEOUS) ×2 IMPLANT
CLIP TI MEDIUM 6 (CLIP) ×4 IMPLANT
CLIP TI WIDE RED SMALL 6 (CLIP) ×4 IMPLANT
CLOSURE STERI-STRIP 1/4X4 (GAUZE/BANDAGES/DRESSINGS) ×1 IMPLANT
DISSECTOR ROUND CHERRY 3/8 STR (MISCELLANEOUS) IMPLANT
DRAPE PED LAPAROTOMY (DRAPES) ×2 IMPLANT
DRESSING SURGICEL FIBRLLR 1X2 (HEMOSTASIS) ×1 IMPLANT
DRSG SURGICEL FIBRILLAR 1X2 (HEMOSTASIS) ×2
ELECT REM PT RETURN 9FT ADLT (ELECTROSURGICAL) ×2
ELECTRODE REM PT RTRN 9FT ADLT (ELECTROSURGICAL) ×1 IMPLANT
GAUZE SPONGE 4X4 16PLY XRAY LF (GAUZE/BANDAGES/DRESSINGS) ×2 IMPLANT
GLOVE BIO SURGEON STRL SZ 6.5 (GLOVE) ×1 IMPLANT
GLOVE BIOGEL PI IND STRL 7.0 (GLOVE) IMPLANT
GLOVE BIOGEL PI IND STRL 7.5 (GLOVE) IMPLANT
GLOVE BIOGEL PI INDICATOR 7.0 (GLOVE) ×1
GLOVE BIOGEL PI INDICATOR 7.5 (GLOVE) ×1
GLOVE SURG ORTHO 8.0 STRL STRW (GLOVE) ×2 IMPLANT
GLOVE SURG SS PI 6.5 STRL IVOR (GLOVE) ×1 IMPLANT
GOWN STRL REUS W/TWL XL LVL3 (GOWN DISPOSABLE) ×5 IMPLANT
KIT BASIN OR (CUSTOM PROCEDURE TRAY) ×2 IMPLANT
NS IRRIG 1000ML POUR BTL (IV SOLUTION) ×2 IMPLANT
PACK BASIC VI WITH GOWN DISP (CUSTOM PROCEDURE TRAY) ×2 IMPLANT
PENCIL BUTTON HOLSTER BLD 10FT (ELECTRODE) ×2 IMPLANT
SHEARS HARMONIC 9CM CVD (BLADE) ×2 IMPLANT
SPONGE GAUZE 4X4 12PLY (GAUZE/BANDAGES/DRESSINGS) ×1 IMPLANT
STAPLER VISISTAT 35W (STAPLE) IMPLANT
STRIP CLOSURE SKIN 1/2X4 (GAUZE/BANDAGES/DRESSINGS) ×2 IMPLANT
SUT MNCRL AB 4-0 PS2 18 (SUTURE) ×2 IMPLANT
SUT SILK 2 0 (SUTURE)
SUT SILK 2-0 18XBRD TIE 12 (SUTURE) IMPLANT
SUT SILK 3 0 (SUTURE)
SUT SILK 3-0 18XBRD TIE 12 (SUTURE) IMPLANT
SUT VIC AB 3-0 SH 18 (SUTURE) ×3 IMPLANT
SYR BULB IRRIGATION 50ML (SYRINGE) ×2 IMPLANT
TOWEL OR 17X26 10 PK STRL BLUE (TOWEL DISPOSABLE) ×2 IMPLANT
TOWEL OR NON WOVEN STRL DISP B (DISPOSABLE) ×2 IMPLANT
YANKAUER SUCT BULB TIP 10FT TU (MISCELLANEOUS) ×2 IMPLANT

## 2014-02-21 NOTE — Brief Op Note (Signed)
02/21/2014  2:58 PM  PATIENT:  Francesco Sor  33 y.o. female  PRE-OPERATIVE DIAGNOSIS:  follicular variant of papillary thyroid carcinoma  POST-OPERATIVE DIAGNOSIS:  follicular variant of papillary thyroid carcinoma  PROCEDURE:  Procedure(s): COMPLETION THYROIDECTOMY (N/A)  SURGEON:  Surgeon(s) and Role:    * [name], MD - Primary  ANESTHESIA:   general  EBL:     BLOOD ADMINISTERED:none  DRAINS: none   LOCAL MEDICATIONS USED:  NONE  SPECIMEN:  Excision  DISPOSITION OF SPECIMEN:  PATHOLOGY  COUNTS:  YES  TOURNIQUET:  * No tourniquets in log *  DICTATION: .Other Dictation: Dictation Number [id number]  PLAN OF CARE: Admit for overnight observation  PATIENT DISPOSITION:  PACU - hemodynamically stable.   Delay start of Pharmacological VTE agent (>24hrs) due to surgical blood loss or risk of bleeding: yes  [name], MD, [facility] Office: [phone]

## 2014-02-21 NOTE — Transfer of Care (Signed)
Immediate Anesthesia Transfer of Care Note  Patient: [name]  Procedure(s) Performed: Procedure(s) (LRB): COMPLETION THYROIDECTOMY (N/A)  Patient Location: PACU  Anesthesia Type: General  Level of Consciousness: sedated, patient cooperative and responds to stimulation  Airway & Oxygen Therapy: Patient Spontanous Breathing and Patient connected to face mask oxgen  Post-op Assessment: Report given to PACU RN and Post -op Vital signs reviewed and stable  Post vital signs: Reviewed and stable  Complications: No apparent anesthesia complications

## 2014-02-21 NOTE — H&P (View-Only) (Signed)
General Surgery [facility]  Chief Complaint  Patient presents with  . New Evaluation    multinodular thyroid goiter - referral from Dr. Jacelyn Pi    HISTORY: Patient is a 33 year old female referred by her endocrinologist for evaluation of thyroid nodule with cytologic atypia. Patient was found on physical examination by her gynecologist to have a thyroid nodule. She subsequently underwent thyroid ultrasound. This showed a normal size right thyroid lobe without nodularity. There was a dominant nodule in the inferior pole of the left thyroid lobe measuring 2.7 cm. There was a dominant nodule in the thyroid isthmus measuring 2.9 cm. Patient subsequently underwent fine-needle aspiration biopsy. Nodule in the isthmus showed changes consistent with nonneoplastic goiter. Nodule in the inferior pole of the left thyroid lobe showed a follicular lesion of undetermined significance with nuclear enlargement, nuclear clearing, and nuclear overlap and grooves. Malignancy could not be excluded.  Patient has no prior history of thyroid disease. She has never been on thyroid medication. She has had no prior head or neck surgery.  Family history is notable for hypothyroidism and the patient's mother. There is no family history of endocrine neoplasm.  Past Medical History  Diagnosis Date  . Abnormal Pap smear of cervix   . Polycystic ovarian syndrome   . HPV (human papilloma virus) infection   . Ovarian cyst, left     S/P excision large cyst  . DVT of lower extremity (deep venous thrombosis)     due to compression from large ovarian cyst  . Iron deficiency     Dr. Beryle Beams  . Obesity     S/P  Gastric Bypass    Current Outpatient Prescriptions  Medication Sig Dispense Refill  . acetaminophen (TYLENOL) 500 MG tablet Take 1,000 mg by mouth daily as needed for headache.      . Cholecalciferol (VITAMIN D-3) 5000 UNITS TABS Take 1 tablet by mouth daily.        . Cyanocobalamin  (VITAMIN B-12) 1000 MCG SUBL Place 1 each under the tongue daily.        . Docosahexaenoic Acid (DHA COMPLETE) 200 MG CAPS Take 1 capsule by mouth daily.        Marland Kitchen ibuprofen (ADVIL,MOTRIN) 600 MG tablet Take 1 tablet (600 mg total) by mouth every 6 (six) hours.  30 tablet  0  . Iron-Vitamin C 100-250 MG TABS Take 1 tablet by mouth daily.        . Multiple Vitamin (MULTIVITAMIN) tablet Take 1 tablet by mouth daily.         No current facility-administered medications for this visit.    No Known Allergies  Family History  Problem Relation Age of Onset  . Hypertension Mother   . Depression Mother   . Osteoporosis Mother   . Thyroid disease Mother     hypo  . Hypertension Father   . COPD Father   . Thyroid disease Father     hypo  . Sleep apnea Father   . Hypertension Maternal Grandmother   . Heart disease Maternal Grandmother   . Diabetes Maternal Grandmother   . Hypertension Maternal Grandfather   . Heart disease Maternal Grandfather   . Diabetes Maternal Grandfather   . Other Daughter     neurofibromatosis    History   Social History  . Marital Status: Married    Spouse Name: N/A    Number of Children: N/A  . Years of Education: N/A   Social History Main Topics  .  Smoking status: Never Smoker   . Smokeless tobacco: Never Used  . Alcohol Use: Yes     Comment: 2 drinks/month  . Drug Use: No  . Sexual Activity: Yes   Other Topics Concern  . None   Social History Narrative  . None    REVIEW OF SYSTEMS - PERTINENT POSITIVES ONLY: Denies compressive symptoms. Denies tremors. Denies palpitation.  EXAM: Filed Vitals:   02/05/14 1556  BP: 133/81  Pulse: 77  Temp: 98.2 F (36.8 C)  Resp: 18    GENERAL: well-developed, well-nourished, no acute distress HEENT: normocephalic; pupils equal and reactive; sclerae clear; dentition good; mucous membranes moist NECK:  Palpable dominant soft nodule in the thyroid isthmus; palpable nodule inferior pole left thyroid  lobe was swallowing; no palpable abnormality in the right thyroid bed; symmetric on extension; no palpable anterior or posterior cervical lymphadenopathy; no supraclavicular masses; no tenderness CHEST: clear to auscultation bilaterally without rales, rhonchi, or wheezes CARDIAC: regular rate and rhythm without significant murmur; peripheral pulses are full EXT:  non-tender without edema; no deformity NEURO: no gross focal deficits; no sign of tremor   LABORATORY RESULTS: See [facility] (CHL-Epic) for most recent results  RADIOLOGY RESULTS: See [facility] (CHL-Epic) for most recent results  IMPRESSION: Left thyroid nodule, 2.9 cm, with cytologic atypia  PLAN: The patient and I reviewed the above findings at length. We reviewed her ultrasound report. We reviewed her pathology reports. We discussed surgical options being thyroid lobectomy for cyst total thyroidectomy. I have recommended left thyroid lobectomy with resection of the isthmus. I would plan to leave the normal right thyroid lobe except for the finding of malignancy. We discussed the risk and benefits of the procedure including risk of injury to recurrent laryngeal nerves and injury to parathyroid glands. We discussed surgical incision to be anticipated. We discussed the hospital stay and the postoperative recovery. We discussed the potential need for lifelong thyroid hormone replacement. We discussed the potential need for radioactive iodine treatment. Patient understands the above issues and wishes to proceed with surgery in the near future. We will make arrangements at a time convenient for the patient.  The risks and benefits of the procedure have been discussed at length with the patient.  The patient understands the proposed procedure, potential alternative treatments, and the course of recovery to be expected.  All of the patient's questions have been answered at this time.  The patient wishes to proceed with  surgery.  Orvis Stann M. Virna Livengood, MD, FACS General & Endocrine Surgery [facility]  Primary Care Physician: SCHOENHOFF,DEBBIE, MD   

## 2014-02-21 NOTE — Interval H&P Note (Signed)
History and Physical Interval Note:  02/21/2014 12:54 PM  [name]  has presented today for surgery, with the diagnosis of follicular variant of papillary thyroid carcinoma.  The various methods of treatment have been discussed with the patient and family. After consideration of risks, benefits and other options for treatment, the patient has consented to    Procedure(s): COMPLETION THYROIDECTOMY (N/A) as a surgical intervention .    The patient's history has been reviewed, patient examined, no change in status, stable for surgery.  I have reviewed the patient's chart and labs.  Questions were answered to the patient's satisfaction.    [name], MD, [facility] Office: [place]

## 2014-02-21 NOTE — Anesthesia Preprocedure Evaluation (Signed)
Anesthesia Evaluation  Patient identified by MRN, date of birth, ID band Patient awake    Reviewed: Allergy & Precautions, H&P , NPO status , Patient's Chart, lab work & pertinent test results  Airway Mallampati: II TM Distance: >3 FB Neck ROM: Full    Dental no notable dental hx.    Pulmonary neg pulmonary ROS,  breath sounds clear to auscultation  Pulmonary exam normal       Cardiovascular + Peripheral Vascular Disease negative cardio ROS  Rhythm:Regular Rate:Normal     Neuro/Psych negative neurological ROS  negative psych ROS   GI/Hepatic negative GI ROS, Neg liver ROS,   Endo/Other  Morbid obesity  Renal/GU negative Renal ROS  negative genitourinary   Musculoskeletal negative musculoskeletal ROS (+)   Abdominal (+) + obese,   Peds negative pediatric ROS (+)  Hematology negative hematology ROS (+) anemia ,   Anesthesia Other Findings   Reproductive/Obstetrics negative OB ROS                           Anesthesia Physical Anesthesia Plan  ASA: II  Anesthesia Plan: General   Post-op Pain Management:    Induction: Intravenous  Airway Management Planned: Oral ETT  Additional Equipment:   Intra-op Plan:   Post-operative Plan: Extubation in OR  Informed Consent: I have reviewed the patients History and Physical, chart, labs and discussed the procedure including the risks, benefits and alternatives for the proposed anesthesia with the patient or authorized representative who has indicated his/her understanding and acceptance.   Dental advisory given  Plan Discussed with: CRNA  Anesthesia Plan Comments: (Did OK with general anesthesia last week.)        Anesthesia Quick Evaluation

## 2014-02-21 NOTE — Preoperative (Signed)
Beta Blockers   Reason not to administer Beta Blockers:Not Applicable 

## 2014-02-21 NOTE — Anesthesia Postprocedure Evaluation (Signed)
  Anesthesia Post-op Note  Patient: [name]  Procedure(s) Performed: Procedure(s) (LRB): COMPLETION THYROIDECTOMY (N/A)  Patient Location: PACU  Anesthesia Type: General  Level of Consciousness: awake and alert   Airway and Oxygen Therapy: Patient Spontanous Breathing  Post-op Pain: mild  Post-op Assessment: Post-op Vital signs reviewed, Patient's Cardiovascular Status Stable, Respiratory Function Stable, Patent Airway and No signs of Nausea or vomiting  Last Vitals:  Filed Vitals:   02/21/14 1600  BP: 130/64  Pulse: 48  Temp:   Resp: 14    Post-op Vital Signs: stable   Complications: No apparent anesthesia complications

## 2014-02-22 ENCOUNTER — Encounter (HOSPITAL_COMMUNITY): Payer: Self-pay | Admitting: Surgery

## 2014-02-22 LAB — BASIC METABOLIC PANEL
BUN: 9 mg/dL (ref 6–23)
CHLORIDE: 105 meq/L (ref 96–112)
CO2: 25 meq/L (ref 19–32)
Calcium: 8.9 mg/dL (ref 8.4–10.5)
Creatinine, Ser: 0.69 mg/dL (ref 0.50–1.10)
GFR calc non Af Amer: >90 mL/min (ref >90)
Glucose, Bld: 128 mg/dL — ABNORMAL HIGH (ref 70–99)
Potassium: 4.6 mEq/L (ref 3.7–5.3)
Sodium: 142 mEq/L (ref 137–147)

## 2014-02-22 MED ORDER — SYNTHROID 100 MCG PO TABS
100.0000 ug | ORAL_TABLET | Freq: Every day | ORAL | Status: DC
Start: 2014-02-22 — End: 2021-11-18

## 2014-02-22 MED ORDER — CALCIUM CARBONATE 1250 (500 CA) MG PO TABS: 2.0000 | ORAL_TABLET | Freq: Two times a day (BID) | ORAL | Status: DC

## 2014-02-22 NOTE — Op Note (Signed)
[name], [name]             ACCOUNT NO.:  [id number]  MEDICAL RECORD NO.:  [id number]  LOCATION:  75                         FACILITY:  [facility]  PHYSICIAN:  [name], MD      DATE OF BIRTH:  [birth year]  DATE OF PROCEDURE:  02/21/2014                               OPERATIVE REPORT   PREOPERATIVE DIAGNOSIS:  Follicular variant papillary thyroid carcinoma.  POSTOP DIAGNOSIS:  Follicular variant papillary thyroid carcinoma.  PROCEDURE:  Completion thyroidectomy.  SURGEON:  [name], MD  ANESTHESIA:  General per Everlean Cherry. Delma Post, MD  ESTIMATED BLOOD LOSS:  Minimal.  PREPARATION:  ChloraPrep.  COMPLICATIONS:  None.  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  Final pathology reveals a 5.4-UJ follicular variant papillary thyroid carcinoma arising in the left thyroid lobe.  The patient now returns to the operating room for completion thyroidectomy.  BODY OF REPORT:  Procedure was done in OR #1, at the [facility].  The patient was brought to the operating room, placed in supine position on the operating room table.  Following administration of general anesthesia, the patient was positioned and then prepped and draped in the usual aseptic fashion.  After ascertaining that an adequate level of anesthesia had been achieved, the cervical incision was reopened with a #15 blade and previously placed suture material was extracted and removed.  Sutures in the platysma are divided and removed.  Sutures in the strap muscles in the midline are divided and removed.  A small seroma at each layer was evacuated.  The left side was explored briefly and there is no sign of complication. There was no palpable lymphadenopathy.  Next, we turned our attention to the right thyroid lobe, which was left in situ.  Strap muscles were reflected laterally and the right lobe  was gently mobilized.  Venous tributaries were divided between Ligaclips with the Harmonic scalpel.  Superior pole was dissected out.  Superior pole vessels divided individually between small and medium Ligaclips with the Harmonic scalpel.  Gland was rolled anteriorly.  Middle thyroid vein was divided between Ligaclips with the Harmonic scalpel.  Inferior venous tributaries were divided between Ligaclips with the Harmonic scalpel.  Gland was rolled anteriorly.  Superior parathyroid was identified and preserved.  There appears to be an anatomic variant consistent with a non recurrent right laryngeal nerve.  The nerve was carefully dissected out and left in situ.  Branches of the inferior thyroid artery were then divided between small Ligaclips with the Harmonic scalpel.  Ligament of Gwenlyn Found was released with the electrocautery and the gland was rolled anteriorly onto the trachea from which it was completely excised.  Specimen was submitted to Pathology for permanent review.  Neck was irrigated with warm saline.  Good hemostasis was achieved. Again, the anatomy is inspected and it does indeed appear that there was a non recurrent right laryngeal nerve, which appears to be uninjured. Good hemostasis was noted.  Fibrillar was placed throughout the operative field.  Strap muscles were reapproximated in the midline with interrupted 3-0 Vicryl sutures.  Platysma was closed with interrupted 3- 0 Vicryl  sutures.  Skin was closed with a running 4-0 Monocryl subcuticular suture.  Wound was washed and dried and benzoin and Steri- Strips were applied.  Sterile dressings were applied.  The patient was awakened from anesthesia and brought to the recovery room.  The patient tolerated the procedure well.   [name], MD, [facility] Office: [phone]   TMG/MEDQ  D:  02/21/2014  T:  02/22/2014  Job:  [id number]

## 2014-02-22 NOTE — Discharge Summary (Signed)
Physician Discharge Summary [facility]  Patient ID: [name] MRN: [id number] DOB/AGE: 1981/07/24 33 y.o.  Admit date: 02/21/2014 Discharge date: 02/22/2014  Admission Diagnoses:  Follicular variant of papillary thyroid carcinoma  Discharge Diagnoses:  Principal Problem:   Papillary carcinoma, follicular variant Active Problems:   Multiple thyroid nodules   Thyroid cancer   Discharged Condition: good  Hospital Course: Patient was admitted for observation following thyroid surgery.  Post op course was uncomplicated.  Pain was well controlled.  Tolerated diet.  Post op calcium level on morning following surgery was 8.9 mg/dl.  Patient was prepared for discharge home on POD#1.  Consults: None  Significant Diagnostic Studies: labs: calcium  Treatments: surgery: completion thyroidectomy  Discharge Exam: Blood pressure 121/80, pulse 62, temperature 97.9 F (36.6 C), temperature source Oral, resp. rate 16, height 5\' 4"  (1.626 m), weight 240 lb (108.863 kg), SpO2 96.00%, not currently breastfeeding. HEENT - clear Neck - incision clear and dry and intact; minimal STS; voice normal Chest - clear bilaterally Cor - RRR   Disposition: Home with family  Discharge Orders   Future Appointments Provider Department Dept Phone   03/07/2014 3:00 PM [name], MD [facility], [place]   04/17/2014 3:00 PM Lanice Shirts, MD [facility] [phone]   Future Orders Complete By Expires   Apply dressing  As directed    Scheduling Instructions:     Apply light gauze dressing to wound before discharge home today.   Diet - low sodium heart healthy  As directed    Discharge instructions  As directed    Comments:     THYROID & PARATHYROID SURGERY - POST OP INSTRUCTIONS  Always review your discharge instruction sheet from the facility where your surgery was performed.  A prescription for pain medication may be given to  you upon discharge.  Take your pain medication as prescribed.  If narcotic pain medicine is not needed, then you may take acetaminophen (Tylenol) or ibuprofen (Advil) as needed.  Take your usually prescribed medications unless otherwise directed.  If you need a refill on your pain medication, please contact your pharmacy. They will contact our office to request authorization.  Prescriptions will not be processed after 5 pm or on weekends.  Start with a light diet upon arrival home, such as soup and crackers or toast.  Be sure to drink plenty of fluids daily.  Resume your normal diet the day after surgery.  Most patients will experience some swelling and bruising on the chest and neck area.  Ice packs will help.  Swelling and bruising can take several days to resolve.   It is common to experience some constipation if taking pain medication after surgery.  Increasing fluid intake and taking a stool softener will usually help or prevent this problem.  A mild laxative (Milk of Magnesia or Miralax) should be taken according to package directions if there are no bowel movements after 48 hours.  You may remove your bandages 24-48 hours after surgery, and you may shower at that time.  You have steri-strips (small skin tapes) in place directly over the incision.  These strips should be left on the skin for 7-10 days and then removed.  You may resume regular (light) daily activities beginning the next day-such as daily self-care, walking, climbing stairs-gradually increasing activities as tolerated.  You may have sexual intercourse when it is comfortable.  Refrain from any heavy lifting or straining until approved  by your doctor.  You may drive when you no longer are taking prescription pain medication, you can comfortably wear a seatbelt, and you can safely maneuver your car and apply brakes.  You should see your doctor in the office for a follow-up appointment approximately two to three weeks after your  surgery.  Make sure that you call for this appointment within a day or two after you arrive home to insure a convenient appointment time.  WHEN TO CALL YOUR DOCTOR: -- Fever greater than 101.5 -- Inability to urinate -- Nausea and/or vomiting - persistent -- Extreme swelling or bruising -- Continued bleeding from incision -- Increased pain, redness, or drainage from the incision -- Difficulty swallowing or breathing -- Muscle cramping or spasms -- Numbness or tingling in hands or around lips  The clinic staff is available to answer your questions during regular business hours.  Please don't hesitate to call and ask to speak to one of the nurses if you have concerns.  [name], MD, [facility] Office: [phone]   Increase activity slowly  As directed    Remove dressing in 24 hours  As directed        Medication List         acetaminophen 500 MG tablet  Commonly known as:  TYLENOL  Take 1,000 mg by mouth daily as needed for headache.     calcium carbonate 1250 MG tablet  Commonly known as:  OS-CAL - dosed in mg of elemental calcium  Take 2 tablets (1,000 mg of elemental calcium total) by mouth 2 (two) times daily with a meal.     DHA COMPLETE 200 MG Caps  Generic drug:  Docosahexaenoic Acid  Take 1 capsule by mouth daily.     HYDROcodone-acetaminophen 5-325 MG per tablet  Commonly known as:  NORCO/VICODIN  Take 1-2 tablets by mouth every 4 (four) hours as needed for moderate pain.     ibuprofen 600 MG tablet  Commonly known as:  ADVIL,MOTRIN  Take 1 tablet (600 mg total) by mouth every 6 (six) hours.     Iron-Vitamin C 100-250 MG Tabs  Take 1 tablet by mouth daily.     multivitamin with minerals Tabs tablet  Take 1 tablet by mouth daily.     SYNTHROID 100 MCG tablet  Generic drug:  levothyroxine  Take 1 tablet (100 mcg total) by mouth daily.     Vitamin B-12 1000 MCG Subl  Place 1 each under the  tongue daily.     Vitamin D 2000 UNITS tablet  Take 2,000 Units by mouth daily.           Follow-up Information   Follow up with [name], MD. Schedule an appointment as soon as possible for a visit in 2 weeks. (For wound re-check)    Specialty:  General Surgery   Contact information:   [address] [place] [address] [phone]       [name], MD, [facility] Office: [phone]   Signed: [name] 02/22/2014, 9:01 AM

## 2014-02-22 NOTE — Progress Notes (Signed)
Discharge instructions given per MD orders. Patient given discharge packet and patient verbalized understanding.

## 2014-02-23 ENCOUNTER — Telehealth (INDEPENDENT_AMBULATORY_CARE_PROVIDER_SITE_OTHER): Payer: Self-pay | Admitting: Surgery

## 2014-02-23 NOTE — Telephone Encounter (Signed)
Called pathology results from second surgery to patient - benign thyroid tissue in right lobe.  [name], MD, [facility] Office: [phone]

## 2014-02-25 ENCOUNTER — Telehealth (INDEPENDENT_AMBULATORY_CARE_PROVIDER_SITE_OTHER): Payer: Self-pay

## 2014-02-25 ENCOUNTER — Other Ambulatory Visit (INDEPENDENT_AMBULATORY_CARE_PROVIDER_SITE_OTHER): Payer: Self-pay

## 2014-02-25 DIAGNOSIS — C73 Malignant neoplasm of thyroid gland: Secondary | ICD-10-CM

## 2014-02-25 NOTE — Telephone Encounter (Signed)
LMOM for pt to call. Pt can be advised lab slip mailed to pt to have calcium level drawn 2 days prior to appt with Dr Harlow Asa.

## 2014-02-25 NOTE — Telephone Encounter (Signed)
Order from Dr Harlow Asa received to make ref to Dr Chalmers Cater. Order placed and given to [facility] to make appt.

## 2014-02-26 NOTE — Telephone Encounter (Signed)
Pt returned call and was given the nurse's instructions.

## 2014-02-27 ENCOUNTER — Encounter (INDEPENDENT_AMBULATORY_CARE_PROVIDER_SITE_OTHER): Payer: Self-pay | Admitting: Surgery

## 2014-03-05 ENCOUNTER — Other Ambulatory Visit (HOSPITAL_COMMUNITY): Payer: Self-pay | Admitting: Endocrinology

## 2014-03-05 DIAGNOSIS — C73 Malignant neoplasm of thyroid gland: Secondary | ICD-10-CM

## 2014-03-07 ENCOUNTER — Encounter (INDEPENDENT_AMBULATORY_CARE_PROVIDER_SITE_OTHER): Payer: Self-pay | Admitting: Surgery

## 2014-03-07 ENCOUNTER — Ambulatory Visit (INDEPENDENT_AMBULATORY_CARE_PROVIDER_SITE_OTHER): Payer: BC Managed Care – PPO | Admitting: Surgery

## 2014-03-07 VITALS — BP 126/78 | HR 77 | Temp 98.3°F | Ht 64.0 in | Wt 241.0 lb

## 2014-03-07 DIAGNOSIS — C73 Malignant neoplasm of thyroid gland: Secondary | ICD-10-CM

## 2014-03-07 NOTE — Patient Instructions (Signed)
  COCOA BUTTER & VITAMIN E CREAM  (Palmer's or other brand)  Apply cocoa butter/vitamin E cream to your incision 2 - 3 times daily.  Massage cream into incision for one minute with each application.  Use sunscreen (50 SPF or higher) for first 6 months after surgery if area is exposed to sun.  You may substitute Mederma or other scar reducing creams as desired.   [name], MD, [facility] Office: [phone]

## 2014-03-07 NOTE — Progress Notes (Signed)
General Surgery [facility]  Chief Complaint  Patient presents with  . 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 She is now taking Synthroid 150 mcg daily under the direction of her endocrinologist. Recent calcium level is normal at 9.5.  EXAM: Surgical wound is healing nicely. No signs or symptoms of infection. Voice quality is normal. No sign of seroma.  IMPRESSION: Status post completion thyroidectomy for thyroid carcinoma.  PLAN: Patient will begin applying topical creams to her incision. She may discontinue supplemental calcium at this time.  Patient will return in 6 weeks for wound check. Radioactive iodine treatment has been scheduled for early next month.  [name], MD, [facility]   Visit Diagnoses: 1. Papillary carcinoma, follicular variant

## 2014-03-18 ENCOUNTER — Encounter (HOSPITAL_COMMUNITY)
Admission: RE | Admit: 2014-03-18 | Discharge: 2014-03-18 | Disposition: A | Discharge status: Home / Self Care | Payer: BC Managed Care – PPO | Source: Ambulatory Visit | Attending: Endocrinology | Admitting: Endocrinology

## 2014-03-18 DIAGNOSIS — C73 Malignant neoplasm of thyroid gland: Secondary | ICD-10-CM | POA: Insufficient documentation

## 2014-03-18 MED ORDER — THYROTROPIN ALFA 1.1 MG IM SOLR
0.9000 mg | INTRAMUSCULAR | Status: AC
  Administered 2014-03-18: 0.9 mg via INTRAMUSCULAR
  Filled 2014-03-18: qty 0.9

## 2014-03-19 ENCOUNTER — Encounter (HOSPITAL_COMMUNITY)
Admission: RE | Admit: 2014-03-19 | Discharge: 2014-03-19 | Disposition: A | Discharge status: Home / Self Care | Payer: BC Managed Care – PPO | Source: Ambulatory Visit | Attending: Endocrinology | Admitting: Endocrinology

## 2014-03-19 MED ORDER — THYROTROPIN ALFA 1.1 MG IM SOLR
0.9000 mg | INTRAMUSCULAR | Status: AC
  Administered 2014-03-19: 0.9 mg via INTRAMUSCULAR
  Filled 2014-03-19: qty 0.9

## 2014-03-20 ENCOUNTER — Encounter (HOSPITAL_COMMUNITY)
Admission: RE | Admit: 2014-03-20 | Discharge: 2014-03-20 | Disposition: A | Discharge status: Home / Self Care | Payer: BC Managed Care – PPO | Source: Ambulatory Visit | Attending: Endocrinology | Admitting: Endocrinology

## 2014-03-20 LAB — HCG, SERUM, QUALITATIVE: PREG SERUM: NEGATIVE

## 2014-03-20 MED ORDER — SODIUM IODIDE I 131 CAPSULE
77.0000 | Freq: Once | INTRAVENOUS | Status: AC | PRN
  Administered 2014-03-20: 77 via ORAL

## 2014-03-27 ENCOUNTER — Encounter (INDEPENDENT_AMBULATORY_CARE_PROVIDER_SITE_OTHER): Payer: Self-pay | Admitting: Surgery

## 2014-03-27 ENCOUNTER — Ambulatory Visit (INDEPENDENT_AMBULATORY_CARE_PROVIDER_SITE_OTHER): Payer: BC Managed Care – PPO | Admitting: Surgery

## 2014-03-27 ENCOUNTER — Encounter (HOSPITAL_COMMUNITY)
Admission: RE | Admit: 2014-03-27 | Discharge: 2014-03-27 | Disposition: A | Discharge status: Home / Self Care | Payer: BC Managed Care – PPO | Source: Ambulatory Visit | Attending: Endocrinology | Admitting: Endocrinology

## 2014-03-27 VITALS — BP 115/70 | HR 90 | Temp 98.5°F | Resp 16 | Ht 64.0 in | Wt 240.6 lb

## 2014-03-27 DIAGNOSIS — C73 Malignant neoplasm of thyroid gland: Secondary | ICD-10-CM | POA: Insufficient documentation

## 2014-03-27 NOTE — Patient Instructions (Signed)
  CARE OF INCISION   Apply cocoa butter/vitamin E cream (Palmer's brand) to your incision 2 - 3 times daily.  Massage cream into incision for one minute with each application.  Use sunscreen (50 SPF or higher) for first 6 months after surgery if area is exposed to sun.  You may alternate Mederma or other scar reducing cream with cocoa butter cream if desired.       Maui Ahart M. Fonnie Crookshanks, MD, FACS      [facility]      Office: [phone]    

## 2014-03-27 NOTE — Progress Notes (Signed)
General Surgery [facility]  Chief Complaint  Patient presents with  . Routine Post Op    thyroidectomy for carcinoma    HISTORY: Patient is a 33 year old female who underwent thyroidectomy for follicular variant of papillary thyroid carcinoma. She was treated with 77 mCi of radioactive iodine 10 days ago. Total body iodine scan was performed this morning. This shows residual activity in the thyroid bed.  There is no evidence of distant metastatic disease.  Patient is currently taking Synthroid 150 mcg daily under the direction of her endocrinologist.  EXAM: Surgical wound is nicely healed. Minimal soft tissue swelling. No sign of seroma. No sign of infection. Voice quality is normal.  IMPRESSION: Follicular grant a papillary thyroid carcinoma  PLAN: Patient will continue to apply topical creams to her incision. She will see her endocrinologist for dosage adjustment and monitoring.  Patient will return in 4 months for physical examination.  [name], MD, [facility]   Visit Diagnoses: 1. Papillary carcinoma, follicular variant

## 2014-04-17 ENCOUNTER — Encounter: Payer: Self-pay | Admitting: Internal Medicine

## 2014-04-17 ENCOUNTER — Ambulatory Visit (INDEPENDENT_AMBULATORY_CARE_PROVIDER_SITE_OTHER): Payer: BC Managed Care – PPO | Admitting: Internal Medicine

## 2014-04-17 VITALS — BP 114/69 | HR 86 | Temp 98.7°F | Resp 18 | Wt 248.0 lb

## 2014-04-17 DIAGNOSIS — Z0000 Encounter for general adult medical examination without abnormal findings: Secondary | ICD-10-CM

## 2014-04-17 DIAGNOSIS — E282 Polycystic ovarian syndrome: Secondary | ICD-10-CM

## 2014-04-17 DIAGNOSIS — D509 Iron deficiency anemia, unspecified: Secondary | ICD-10-CM

## 2014-04-17 DIAGNOSIS — C73 Malignant neoplasm of thyroid gland: Secondary | ICD-10-CM

## 2014-04-17 DIAGNOSIS — E611 Iron deficiency: Secondary | ICD-10-CM

## 2014-04-17 NOTE — Progress Notes (Signed)
Subjective:    Patient ID: [name], female    DOB: Aug 08, 1981, 33 y.o.   MRN: [id number]  HPI  [name]  Is here for CPE.     Since last visit  She is post partum with 70 month old girl at home  and was discovered to have follicular variant of papillary thyroid carcinoma.   She is S/P XRT treatement and now is on thyroid replacement.    Metastatic work up is negative.      Overall doing well  Back at work for the last 2 weeks.  Mother taking care of baby.  She has noticed more irritability and short temepered.  She denies daily depression,  She denies anxiety.  She has 3 children  16,16 and 53 month old.    She is S/P Gastric bypass and has not had B vitamins or folate  checked in a while.   HM  utd with mm and pap per gyn  No Known Allergies Past Medical History  Diagnosis Date  . Abnormal Pap smear of cervix   . Polycystic ovarian syndrome   . HPV (human papilloma virus) infection   . Ovarian cyst, left     S/P excision large cyst  . Iron deficiency     CONSULT BY Dr. Beryle Beams - "PRESUMED IRON MALABORPTION ANEMIA SECONDARY TO PREVIOUS GASTRIC BYPASS SURGERY"  . Obesity     S/P  Gastric Bypass  . DVT of lower extremity (deep venous thrombosis) 1998    due to compression from large ovarian cyst-HAS VENA CAVA FILTER IN PLACE  . Anemia   . Patient is a currently breast-feeding mother     DELIVERED BABY 01-01-14  . Left thyroid nodule    Past Surgical History  Procedure Laterality Date  . Left oophorectomy  11/98  . Vena cava filter placement  11/98    [place] filter  . Roux-en-y procedure  10/2004  . Hysteroscopy    . Gastric by-pass    . Thyroid lobectomy Left 02/14/2014    Procedure: LEFT THYROID LOBECTOMY;  Surgeon: [name], MD;  Location: [facility];  Service: General;  Laterality: Left;  . Thyroidectomy N/A 02/21/2014    Procedure: COMPLETION THYROIDECTOMY;  Surgeon: [name], MD;  Location: [facility];  Service: General;  Laterality: N/A;   History    Social History  . Marital Status: Married    Spouse Name: N/A    Number of Children: N/A  . Years of Education: N/A   Occupational History  . Not on file.   Social History Main Topics  . Smoking status: Never Smoker   . Smokeless tobacco: Never Used  . Alcohol Use: Yes     Comment: 2 drinks/month--NO ALCOHOL FOR PAST YEAR DUE TO PREGNANCY  . Drug Use: No  . Sexual Activity: Yes   Other Topics Concern  . Not on file   Social History Narrative  . No narrative on file   Family History  Problem Relation Age of Onset  . Hypertension Mother   . Depression Mother   . Osteoporosis Mother   . Thyroid disease Mother     hypo  . Hypertension Father   . COPD Father   . Thyroid disease Father     hypo  . Sleep apnea Father   . Hypertension Maternal Grandmother   . Heart disease Maternal Grandmother   . Diabetes Maternal Grandmother   . Hypertension Maternal Grandfather   . Heart disease Maternal Grandfather   .  Diabetes Maternal Grandfather   . Other Daughter     neurofibromatosis   Patient Active Problem List   Diagnosis Date Noted  . Thyroid cancer 02/21/2014  . Papillary carcinoma, follicular variant 56/43/3295  . Papillary thyroid carcinoma 02/19/2014  . Thyroid nodule 02/14/2014  . Left thyroid nodule 01/21/2014  . Normal pregnancy 01/01/2014  . NSVD (normal spontaneous vaginal delivery) 01/01/2014  . Morbid obesity 04/16/2013  . Abnormal Pap smear of cervix   . HPV (human papilloma virus) infection   . Ovarian cyst, left   . Iron deficiency   . Obesity   . Polycystic ovarian syndrome   . DVT of lower extremity (deep venous thrombosis)    Current Outpatient Prescriptions on File Prior to Visit  Medication Sig Dispense Refill  . acetaminophen (TYLENOL) 500 MG tablet Take 1,000 mg by mouth daily as needed for headache.      . Cholecalciferol (VITAMIN D) 2000 UNITS tablet Take 2,000 Units by mouth daily.      . Cyanocobalamin (VITAMIN B-12) 1000 MCG SUBL  Place 1 each under the tongue daily.        . Docosahexaenoic Acid (DHA COMPLETE) 200 MG CAPS Take 1 capsule by mouth daily.        Marland Kitchen ibuprofen (ADVIL,MOTRIN) 600 MG tablet Take 1 tablet (600 mg total) by mouth every 6 (six) hours.  30 tablet  0  . Iron-Vitamin C 100-250 MG TABS Take 1 tablet by mouth daily.        . Multiple Vitamin (MULTIVITAMIN WITH MINERALS) TABS tablet Take 1 tablet by mouth daily.      Marland Kitchen SYNTHROID 100 MCG tablet Take 1 tablet (100 mcg total) by mouth daily.  30 tablet  3   No current facility-administered medications on file prior to visit.      Review of Systems See HPI    Objective:   Physical Exam Physical Exam  Nursing note and vitals reviewed.  Constitutional: She is oriented to person, place, and time. She appears well-developed and well-nourished.  HENT:  Head: Normocephalic and atraumatic.  Right Ear: Tympanic membrane and ear canal normal. No drainage. Tympanic membrane is not injected and not erythematous.  Left Ear: Tympanic membrane and ear canal normal. No drainage. Tympanic membrane is not injected and not erythematous.  Nose: Nose normal. Right sinus exhibits no maxillary sinus tenderness and no frontal sinus tenderness. Left sinus exhibits no maxillary sinus tenderness and no frontal sinus tenderness.  Mouth/Throat: Oropharynx is clear and moist. No oral lesions. No oropharyngeal exudate.  Eyes: Conjunctivae and EOM are normal. Pupils are equal, round, and reactive to light.  Neck: Normal range of motion. Neck supple. No JVD present. Carotid bruit is not present. No mass and no thyromegaly present.  Healing thyroidiectomy scar Cardiovascular: Normal rate, regular rhythm, S1 normal, S2 normal and intact distal pulses. Exam reveals no gallop and no friction rub.  No murmur heard.  Pulses:  Carotid pulses are 2+ on the right side, and 2+ on the left side.  Dorsalis pedis pulses are 2+ on the right side, and 2+ on the left side.  No carotid bruit.  No LE edema  Pulmonary/Chest: Breath sounds normal. She has no wheezes. She has no rales. She exhibits no tenderness.  Abdominal: Soft. Bowel sounds are normal. She exhibits no distension and no mass. There is no hepatosplenomegaly. There is no tenderness. There is no CVA tenderness.  Musculoskeletal: Normal range of motion.  No active synovitis to joints.  Lymphadenopathy:  She has no cervical adenopathy.  She has no axillary adenopathy.  Right: No inguinal and no supraclavicular adenopathy present.  Left: No inguinal and no supraclavicular adenopathy present.  Neurological: She is alert and oriented to person, place, and time. She has normal strength and normal reflexes. She displays no tremor. No cranial nerve deficit or sensory deficit. Coordination and gait normal.  Skin: Skin is warm and dry. No rash noted. No cyanosis. Nails show no clubbing.  Psychiatric: She has a normal mood and affect. Her speech is normal and behavior is normal. Cognition and memory are normal.          Assessment & Plan:  HM  See scanned sheet utd had TDAp post partum  Papillary thyroid carcinoma.  S/p XRT  metastic work up neg  Irritability  i gave pt number to two therapists for talking therapy>   Any worsening she is ot call me  Obeisty S/P Gastric bypass will check  b12  b1 and folate with iron  Slightly elevated glucose  Will recheck fasting  Se eme as needed

## 2014-04-17 NOTE — Patient Instructions (Signed)
Therapist:  Levy Pupa:  [phone]  Vanetta Mulders:  [phone]  See me as needed

## 2014-04-18 ENCOUNTER — Encounter (INDEPENDENT_AMBULATORY_CARE_PROVIDER_SITE_OTHER): Payer: BC Managed Care – PPO | Admitting: Surgery

## 2014-06-11 ENCOUNTER — Encounter: Payer: Self-pay | Admitting: Internal Medicine

## 2014-06-17 ENCOUNTER — Telehealth: Payer: Self-pay | Admitting: *Deleted

## 2014-06-17 NOTE — Telephone Encounter (Signed)
[name] called and left a message requesting her lab results from May.  I looked in chart and do not see where we received any.  I believe we wrote her a script for them; she was going to have them drawn at [facility] along with some other labs she had to have drawn. I left her a message to give [place] a call back, but I am unsure of how we get results from [facility] since they weren't faxed to [place]

## 2014-06-18 NOTE — Telephone Encounter (Signed)
Spoke to pt and she states she had labs drawn for her endocrinologist along with a handwritten script for labs that Dr. Coralyn Mark wanted. I called [facility] and they state she had labs drawn on 05/02/14 and they will fax results.

## 2014-08-01 ENCOUNTER — Encounter (INDEPENDENT_AMBULATORY_CARE_PROVIDER_SITE_OTHER): Payer: Self-pay | Admitting: Surgery

## 2014-10-14 ENCOUNTER — Encounter: Payer: Self-pay | Admitting: Internal Medicine

## 2015-02-18 ENCOUNTER — Other Ambulatory Visit: Payer: Self-pay | Admitting: Endocrinology

## 2015-02-18 DIAGNOSIS — C73 Malignant neoplasm of thyroid gland: Secondary | ICD-10-CM

## 2015-02-24 ENCOUNTER — Encounter (HOSPITAL_COMMUNITY)
Admission: RE | Admit: 2015-02-24 | Discharge: 2015-02-24 | Disposition: A | Discharge status: Home / Self Care | Payer: BLUE CROSS/BLUE SHIELD | Source: Ambulatory Visit | Attending: Endocrinology | Admitting: Endocrinology

## 2015-02-24 DIAGNOSIS — C73 Malignant neoplasm of thyroid gland: Secondary | ICD-10-CM | POA: Insufficient documentation

## 2015-02-24 MED ORDER — THYROTROPIN ALFA 1.1 MG IM SOLR
0.9000 mg | INTRAMUSCULAR | Status: AC
  Administered 2015-02-24: 0.9 mg via INTRAMUSCULAR

## 2015-02-25 ENCOUNTER — Encounter (HOSPITAL_COMMUNITY)
Admission: RE | Admit: 2015-02-25 | Discharge: 2015-02-25 | Disposition: A | Discharge status: Home / Self Care | Payer: BLUE CROSS/BLUE SHIELD | Source: Ambulatory Visit | Attending: Endocrinology | Admitting: Endocrinology

## 2015-02-25 DIAGNOSIS — C73 Malignant neoplasm of thyroid gland: Secondary | ICD-10-CM | POA: Diagnosis not present

## 2015-02-25 MED ORDER — THYROTROPIN ALFA 1.1 MG IM SOLR
0.9000 mg | INTRAMUSCULAR | Status: AC
  Administered 2015-02-25: 0.9 mg via INTRAMUSCULAR

## 2015-02-26 ENCOUNTER — Encounter (HOSPITAL_COMMUNITY): Payer: BLUE CROSS/BLUE SHIELD

## 2015-02-26 DIAGNOSIS — C73 Malignant neoplasm of thyroid gland: Secondary | ICD-10-CM | POA: Diagnosis not present

## 2015-02-26 LAB — HCG, SERUM, QUALITATIVE: PREG SERUM: NEGATIVE

## 2015-02-28 ENCOUNTER — Encounter (HOSPITAL_COMMUNITY)
Admission: RE | Admit: 2015-02-28 | Discharge: 2015-02-28 | Disposition: A | Discharge status: Home / Self Care | Payer: BLUE CROSS/BLUE SHIELD | Source: Ambulatory Visit | Attending: Endocrinology | Admitting: Endocrinology

## 2015-02-28 DIAGNOSIS — C73 Malignant neoplasm of thyroid gland: Secondary | ICD-10-CM | POA: Diagnosis not present

## 2015-02-28 MED ORDER — SODIUM IODIDE I 131 CAPSULE
4.0000 | Freq: Once | INTRAVENOUS | Status: AC | PRN
  Administered 2015-02-28: 4 via ORAL

## 2015-05-30 IMAGING — CR DG CHEST 2V
2 series · 2 of 2 positions shown · non-contrast
Comparison: None.

CLINICAL DATA: Preoperative thyroid lobectomy

EXAM:
CHEST  2 VIEW

[w chest pa]
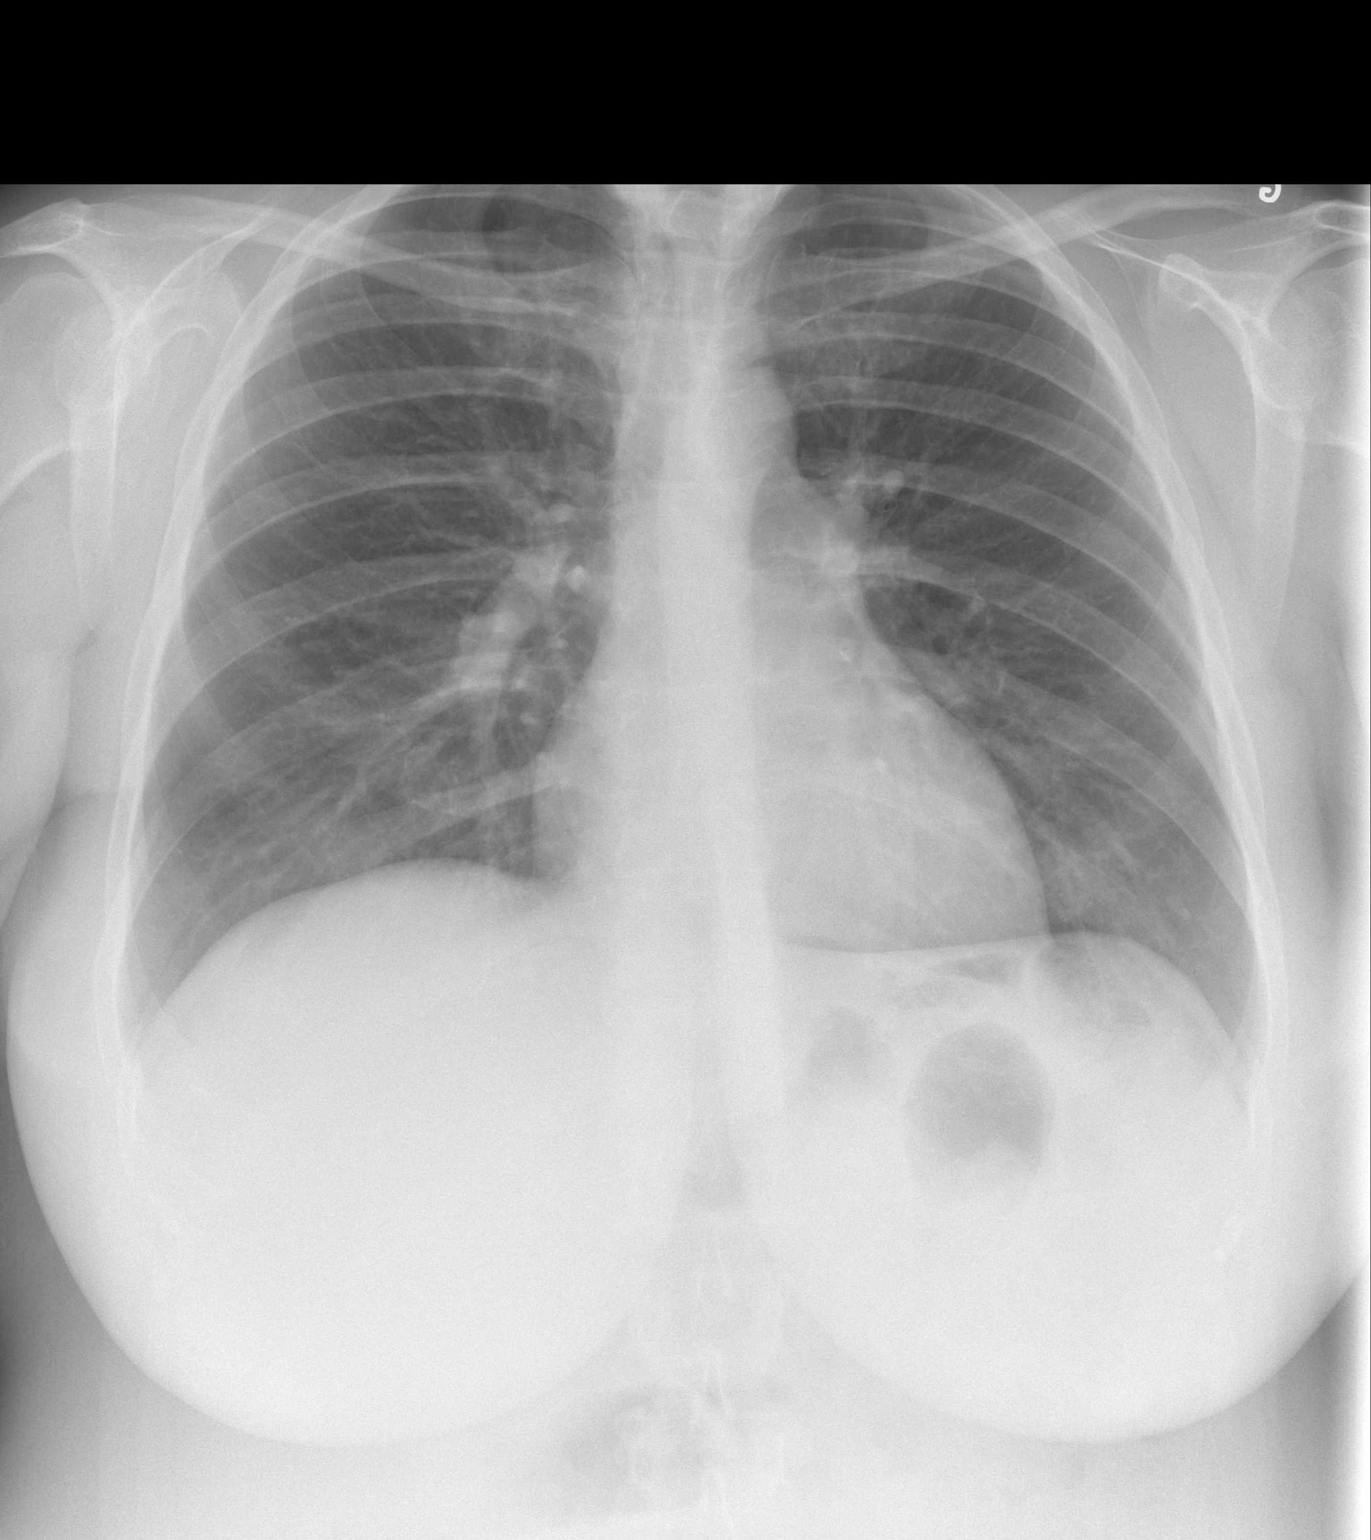

[w chest lat]
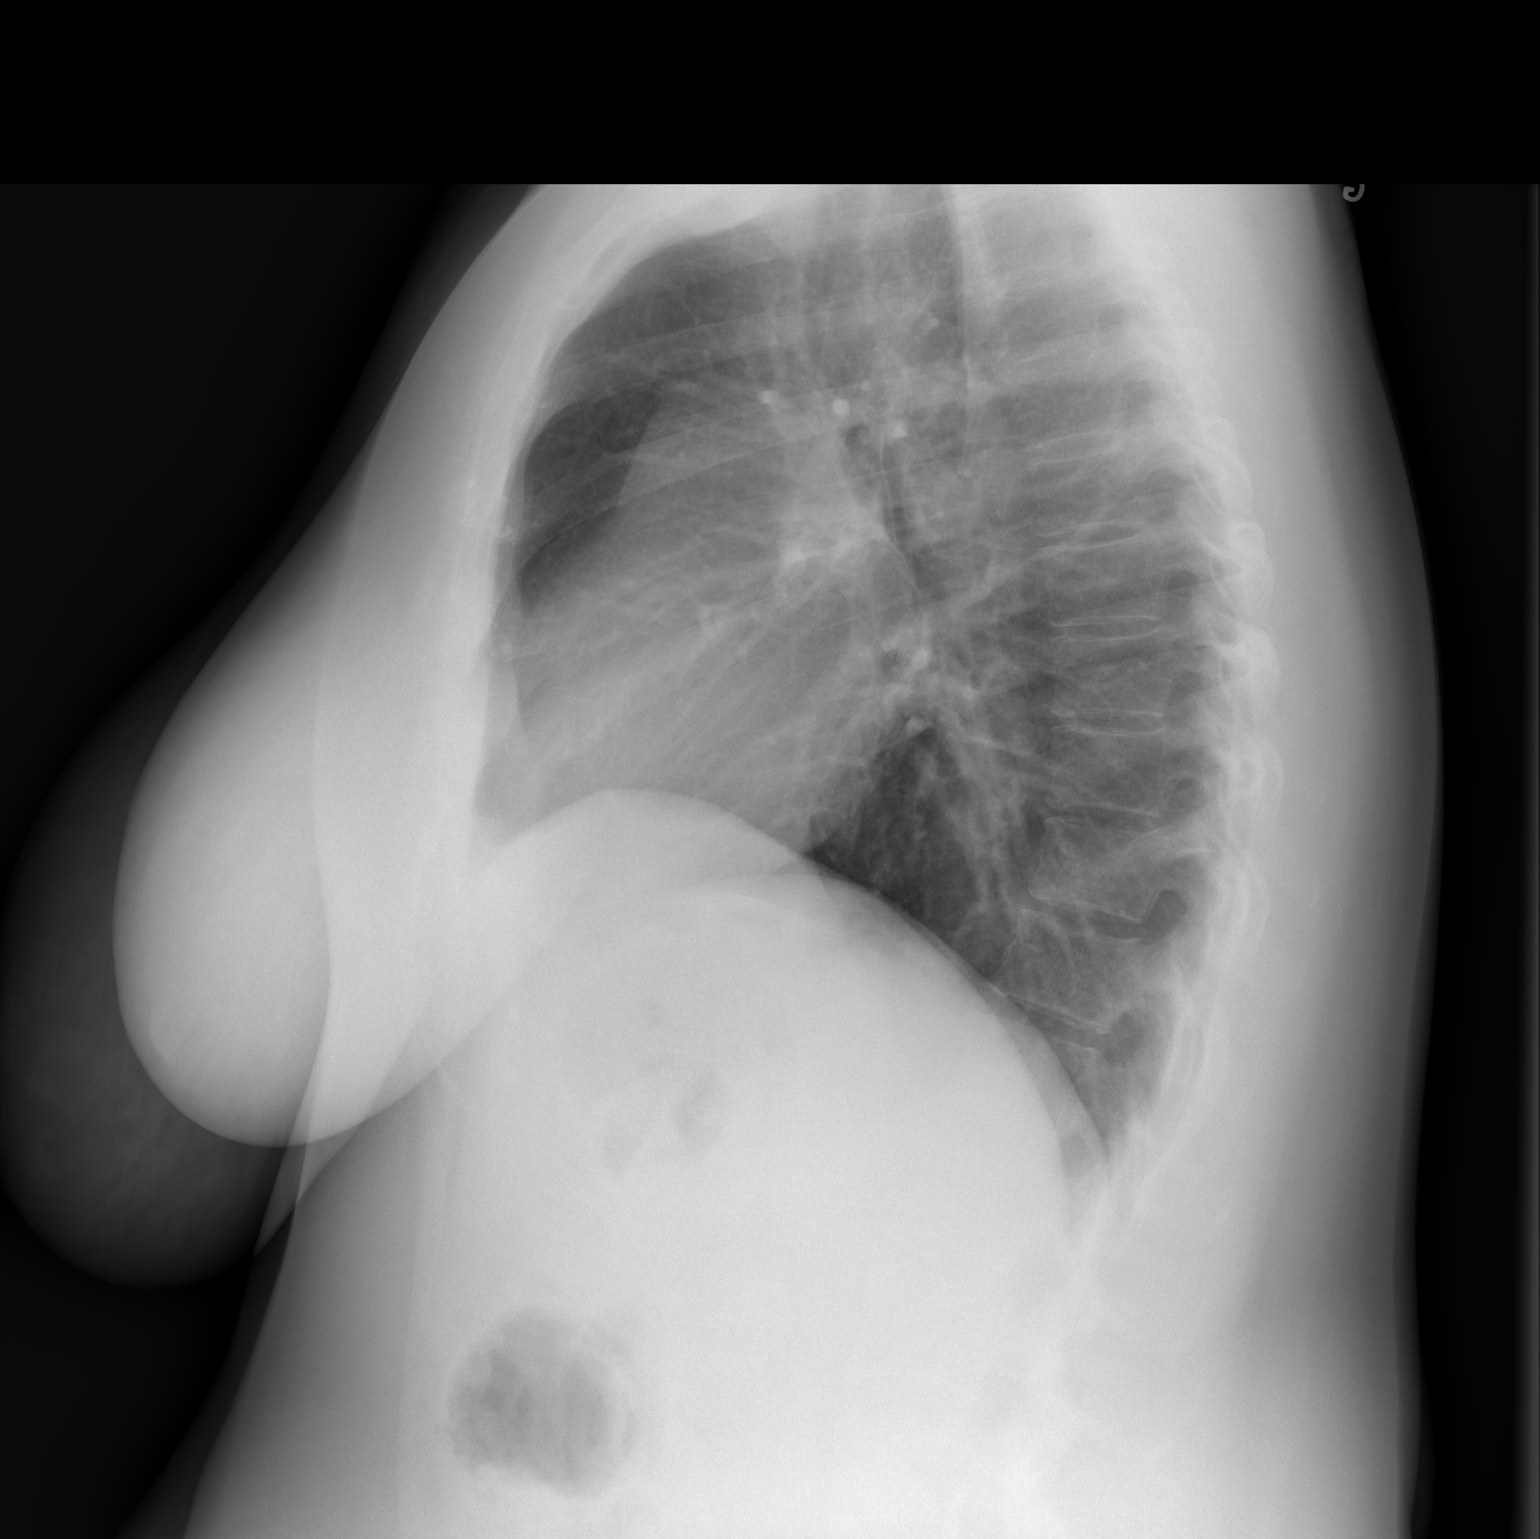

[2 of 2 positions shown; findings below may reference images not displayed]

FINDINGS: Lungs are clear. Heart size and pulmonary vascularity are normal. No
adenopathy. No bone lesions. Trachea is in the midline.
IMPRESSION: No abnormality noted.

## 2015-12-31 MED FILL — LEVOTHYROXINE 150 MCG TAB: 150 | 30 days supply | Qty: 30 | Fill #3

## 2016-01-06 MED FILL — FLUCONAZOLE 150 MG TABLET: 150 | 2 days supply | Qty: 2 | Fill #0

## 2016-02-03 MED FILL — LEVOTHYROXINE 150 MCG TAB: 150 | 30 days supply | Qty: 30 | Fill #4

## 2016-02-11 DIAGNOSIS — C73 Malignant neoplasm of thyroid gland: Secondary | ICD-10-CM | POA: Diagnosis not present

## 2016-02-11 DIAGNOSIS — E890 Postprocedural hypothyroidism: Secondary | ICD-10-CM | POA: Diagnosis not present

## 2016-02-17 DIAGNOSIS — E890 Postprocedural hypothyroidism: Secondary | ICD-10-CM | POA: Diagnosis not present

## 2016-02-17 DIAGNOSIS — C73 Malignant neoplasm of thyroid gland: Secondary | ICD-10-CM | POA: Diagnosis not present

## 2016-02-17 MED FILL — LEVOTHYROXINE 137 MCG TAB: 137 | 30 days supply | Qty: 30 | Fill #0

## 2016-02-19 MED FILL — SYNTHROID 137 MCG TABLET: 137 | 30 days supply | Qty: 30 | Fill #0

## 2016-03-17 MED FILL — SYNTHROID 137 MCG TABLET: 137 | 30 days supply | Qty: 30 | Fill #1

## 2016-04-19 DIAGNOSIS — E890 Postprocedural hypothyroidism: Secondary | ICD-10-CM | POA: Diagnosis not present

## 2016-04-21 MED FILL — SYNTHROID 137 MCG TABLET: 137 | 90 days supply | Qty: 90 | Fill #2

## 2016-07-12 MED FILL — SYNTHROID 137 MCG TABLET: 137 | 90 days supply | Qty: 90 | Fill #3

## 2016-09-20 DIAGNOSIS — L739 Follicular disorder, unspecified: Secondary | ICD-10-CM | POA: Diagnosis not present

## 2016-09-20 DIAGNOSIS — L309 Dermatitis, unspecified: Secondary | ICD-10-CM | POA: Diagnosis not present

## 2016-10-08 MED FILL — TRIAMCINOLONE 0.1% CREAM: 0.1 | 30 days supply | Qty: 60 | Fill #0

## 2016-10-14 MED FILL — SYNTHROID 137 MCG TABLET: 137 | 90 days supply | Qty: 90 | Fill #4

## 2016-10-22 DIAGNOSIS — Z01419 Encounter for gynecological examination (general) (routine) without abnormal findings: Secondary | ICD-10-CM | POA: Diagnosis not present

## 2016-10-22 DIAGNOSIS — Z130 Encounter for screening for diseases of the blood and blood-forming organs and certain disorders involving the immune mechanism: Secondary | ICD-10-CM | POA: Diagnosis not present

## 2016-10-22 DIAGNOSIS — Z1389 Encounter for screening for other disorder: Secondary | ICD-10-CM | POA: Diagnosis not present

## 2016-10-22 DIAGNOSIS — Z6841 Body Mass Index (BMI) 40.0 and over, adult: Secondary | ICD-10-CM | POA: Diagnosis not present

## 2016-10-22 DIAGNOSIS — F3289 Other specified depressive episodes: Secondary | ICD-10-CM | POA: Diagnosis not present

## 2016-10-22 DIAGNOSIS — E039 Hypothyroidism, unspecified: Secondary | ICD-10-CM | POA: Diagnosis not present

## 2016-10-22 MED FILL — ESCITALOPRAM 10 MG TABLET: 10 | 30 days supply | Qty: 30 | Fill #0

## 2016-11-16 DIAGNOSIS — Z1231 Encounter for screening mammogram for malignant neoplasm of breast: Secondary | ICD-10-CM | POA: Diagnosis not present

## 2016-11-16 MED FILL — ESCITALOPRAM 10 MG TABLET: 10 | 30 days supply | Qty: 30 | Fill #1

## 2016-12-14 MED FILL — ESCITALOPRAM 10 MG TABLET: 10 | 30 days supply | Qty: 30 | Fill #2

## 2017-01-14 MED FILL — ESCITALOPRAM 10 MG TABLET: 10 | 30 days supply | Qty: 30 | Fill #3

## 2017-01-14 MED FILL — SYNTHROID 137 MCG TABLET: 137 | 90 days supply | Qty: 90 | Fill #5

## 2017-02-10 DIAGNOSIS — E890 Postprocedural hypothyroidism: Secondary | ICD-10-CM | POA: Diagnosis not present

## 2017-02-10 DIAGNOSIS — C73 Malignant neoplasm of thyroid gland: Secondary | ICD-10-CM | POA: Diagnosis not present

## 2017-02-16 MED FILL — ESCITALOPRAM 10 MG TABLET: 10 | 30 days supply | Qty: 30 | Fill #0

## 2017-02-17 DIAGNOSIS — C73 Malignant neoplasm of thyroid gland: Secondary | ICD-10-CM | POA: Diagnosis not present

## 2017-02-17 DIAGNOSIS — E890 Postprocedural hypothyroidism: Secondary | ICD-10-CM | POA: Diagnosis not present

## 2017-02-17 MED FILL — SYNTHROID 150 MCG TABLET: 150 | 90 days supply | Qty: 90 | Fill #0

## 2017-03-23 MED FILL — ESCITALOPRAM 10 MG TABLET: 10 | 30 days supply | Qty: 30 | Fill #1

## 2017-04-19 DIAGNOSIS — E890 Postprocedural hypothyroidism: Secondary | ICD-10-CM | POA: Diagnosis not present

## 2017-05-17 MED FILL — SYNTHROID 150 MCG TABLET: 150 | 90 days supply | Qty: 90 | Fill #1

## 2017-05-17 MED FILL — ESCITALOPRAM 10 MG TABLET: 10 | 30 days supply | Qty: 30 | Fill #2

## 2017-06-23 DIAGNOSIS — B372 Candidiasis of skin and nail: Secondary | ICD-10-CM | POA: Diagnosis not present

## 2017-06-23 DIAGNOSIS — D485 Neoplasm of uncertain behavior of skin: Secondary | ICD-10-CM | POA: Diagnosis not present

## 2017-06-23 DIAGNOSIS — D2239 Melanocytic nevi of other parts of face: Secondary | ICD-10-CM | POA: Diagnosis not present

## 2017-06-23 DIAGNOSIS — L304 Erythema intertrigo: Secondary | ICD-10-CM | POA: Diagnosis not present

## 2017-06-23 MED FILL — FLUCONAZOLE 100 MG TABLET: 100 | 56 days supply | Qty: 32 | Fill #0

## 2017-07-19 MED FILL — ESCITALOPRAM 10 MG TABLET: 10 | 30 days supply | Qty: 30 | Fill #3

## 2017-08-17 MED FILL — SYNTHROID 150 MCG TABLET: 150 | 90 days supply | Qty: 90 | Fill #2

## 2017-08-31 MED FILL — ESCITALOPRAM 10 MG TABLET: 10 | 30 days supply | Qty: 30 | Fill #0

## 2017-09-28 MED FILL — ESCITALOPRAM 10 MG TABLET: 10 | 30 days supply | Qty: 30 | Fill #1

## 2017-10-25 MED FILL — FLUCONAZOLE 100 MG TABLET: 100 | 56 days supply | Qty: 32 | Fill #1

## 2017-10-25 MED FILL — ESCITALOPRAM 10 MG TABLET: 10 | 30 days supply | Qty: 30 | Fill #2

## 2017-11-15 MED FILL — SYNTHROID 150 MCG TABLET: 150 | 90 days supply | Qty: 90 | Fill #3

## 2017-11-28 MED FILL — ESCITALOPRAM 10 MG TABLET: 10 | 30 days supply | Qty: 30 | Fill #3

## 2018-01-02 MED FILL — ESCITALOPRAM 10 MG TABLET: 10 | 30 days supply | Qty: 30 | Fill #0

## 2018-01-24 DIAGNOSIS — H6502 Acute serous otitis media, left ear: Secondary | ICD-10-CM | POA: Diagnosis not present

## 2018-02-09 MED FILL — SYNTHROID 150 MCG TABLET: 150 | 30 days supply | Qty: 30 | Fill #4

## 2018-02-14 DIAGNOSIS — C73 Malignant neoplasm of thyroid gland: Secondary | ICD-10-CM | POA: Diagnosis not present

## 2018-02-14 DIAGNOSIS — E890 Postprocedural hypothyroidism: Secondary | ICD-10-CM | POA: Diagnosis not present

## 2018-02-28 DIAGNOSIS — Z1389 Encounter for screening for other disorder: Secondary | ICD-10-CM | POA: Diagnosis not present

## 2018-02-28 DIAGNOSIS — R87610 Atypical squamous cells of undetermined significance on cytologic smear of cervix (ASC-US): Secondary | ICD-10-CM | POA: Diagnosis not present

## 2018-02-28 DIAGNOSIS — Z6841 Body Mass Index (BMI) 40.0 and over, adult: Secondary | ICD-10-CM | POA: Diagnosis not present

## 2018-02-28 DIAGNOSIS — Z01419 Encounter for gynecological examination (general) (routine) without abnormal findings: Secondary | ICD-10-CM | POA: Diagnosis not present

## 2018-02-28 DIAGNOSIS — Z124 Encounter for screening for malignant neoplasm of cervix: Secondary | ICD-10-CM | POA: Diagnosis not present

## 2018-02-28 DIAGNOSIS — Z130 Encounter for screening for diseases of the blood and blood-forming organs and certain disorders involving the immune mechanism: Secondary | ICD-10-CM | POA: Diagnosis not present

## 2018-03-10 DIAGNOSIS — E890 Postprocedural hypothyroidism: Secondary | ICD-10-CM | POA: Diagnosis not present

## 2018-03-10 DIAGNOSIS — C73 Malignant neoplasm of thyroid gland: Secondary | ICD-10-CM | POA: Diagnosis not present

## 2018-03-10 MED FILL — SYNTHROID 175 MCG TABLET: 175 | 90 days supply | Qty: 90 | Fill #0

## 2018-05-10 DIAGNOSIS — E890 Postprocedural hypothyroidism: Secondary | ICD-10-CM | POA: Diagnosis not present

## 2018-06-05 MED FILL — SYNTHROID 175 MCG TABLET: 175 | 90 days supply | Qty: 90 | Fill #1

## 2018-07-19 DIAGNOSIS — Z139 Encounter for screening, unspecified: Secondary | ICD-10-CM | POA: Diagnosis not present

## 2018-08-10 DIAGNOSIS — E890 Postprocedural hypothyroidism: Secondary | ICD-10-CM | POA: Diagnosis not present

## 2018-08-11 MED FILL — SYNTHROID 200 MCG TABLET: 200 | 30 days supply | Qty: 30 | Fill #0

## 2018-08-28 DIAGNOSIS — E890 Postprocedural hypothyroidism: Secondary | ICD-10-CM | POA: Diagnosis not present

## 2018-09-13 MED FILL — SYNTHROID 200 MCG TABLET: 200 | 30 days supply | Qty: 30 | Fill #1

## 2018-10-03 DIAGNOSIS — Z139 Encounter for screening, unspecified: Secondary | ICD-10-CM | POA: Diagnosis not present

## 2018-10-09 DIAGNOSIS — E890 Postprocedural hypothyroidism: Secondary | ICD-10-CM | POA: Diagnosis not present

## 2018-10-12 MED FILL — SYNTHROID 200 MCG TABLET: 200 | 30 days supply | Qty: 30 | Fill #2

## 2018-11-01 ENCOUNTER — Ambulatory Visit (INDEPENDENT_AMBULATORY_CARE_PROVIDER_SITE_OTHER): Payer: Self-pay | Admitting: Nurse Practitioner

## 2018-11-01 DIAGNOSIS — Z111 Encounter for screening for respiratory tuberculosis: Secondary | ICD-10-CM

## 2018-11-04 LAB — TB SKIN TEST
Induration: NEGATIVE mm
TB SKIN TEST: NEGATIVE

## 2018-11-06 MED FILL — SYNTHROID 200 MCG TABLET: 200 | 30 days supply | Qty: 30 | Fill #3

## 2018-11-07 DIAGNOSIS — M25561 Pain in right knee: Secondary | ICD-10-CM | POA: Diagnosis not present

## 2018-11-07 MED FILL — MELOXICAM 15 MG TABLET: 15 | 30 days supply | Qty: 30 | Fill #0

## 2018-12-12 MED FILL — MELOXICAM 15 MG TABLET: 15 | 30 days supply | Qty: 30 | Fill #1

## 2018-12-12 MED FILL — SYNTHROID 200 MCG TABLET: 200 | 30 days supply | Qty: 30 | Fill #4

## 2019-01-11 MED FILL — SYNTHROID 200 MCG TABLET: 200 | 30 days supply | Qty: 30 | Fill #5

## 2019-02-12 MED FILL — SYNTHROID 200 MCG TABLET: 200 | 90 days supply | Qty: 90 | Fill #0

## 2019-03-02 DIAGNOSIS — Z1389 Encounter for screening for other disorder: Secondary | ICD-10-CM | POA: Diagnosis not present

## 2019-03-02 DIAGNOSIS — Z6841 Body Mass Index (BMI) 40.0 and over, adult: Secondary | ICD-10-CM | POA: Diagnosis not present

## 2019-03-02 DIAGNOSIS — Z01419 Encounter for gynecological examination (general) (routine) without abnormal findings: Secondary | ICD-10-CM | POA: Diagnosis not present

## 2019-03-02 DIAGNOSIS — Z130 Encounter for screening for diseases of the blood and blood-forming organs and certain disorders involving the immune mechanism: Secondary | ICD-10-CM | POA: Diagnosis not present

## 2019-03-02 DIAGNOSIS — Z124 Encounter for screening for malignant neoplasm of cervix: Secondary | ICD-10-CM | POA: Diagnosis not present

## 2019-03-05 DIAGNOSIS — Z124 Encounter for screening for malignant neoplasm of cervix: Secondary | ICD-10-CM | POA: Diagnosis not present

## 2019-03-08 DIAGNOSIS — C73 Malignant neoplasm of thyroid gland: Secondary | ICD-10-CM | POA: Diagnosis not present

## 2019-03-08 DIAGNOSIS — E890 Postprocedural hypothyroidism: Secondary | ICD-10-CM | POA: Diagnosis not present

## 2019-03-13 DIAGNOSIS — C73 Malignant neoplasm of thyroid gland: Secondary | ICD-10-CM | POA: Diagnosis not present

## 2019-03-13 DIAGNOSIS — E890 Postprocedural hypothyroidism: Secondary | ICD-10-CM | POA: Diagnosis not present

## 2019-03-13 MED FILL — SYNTHROID 175 MCG TABLET: 175 | 30 days supply | Qty: 30 | Fill #0

## 2019-04-30 MED FILL — SYNTHROID 175 MCG TABLET: 175 | 30 days supply | Qty: 30 | Fill #1

## 2019-05-11 DIAGNOSIS — C73 Malignant neoplasm of thyroid gland: Secondary | ICD-10-CM | POA: Diagnosis not present

## 2019-05-11 DIAGNOSIS — E890 Postprocedural hypothyroidism: Secondary | ICD-10-CM | POA: Diagnosis not present

## 2019-05-22 ENCOUNTER — Other Ambulatory Visit: Payer: Self-pay | Admitting: Endocrinology

## 2019-05-22 DIAGNOSIS — C73 Malignant neoplasm of thyroid gland: Secondary | ICD-10-CM

## 2019-05-29 MED FILL — SYNTHROID 175 MCG TABLET: 175 | 90 days supply | Qty: 90 | Fill #2

## 2019-06-05 ENCOUNTER — Ambulatory Visit
Admission: RE | Admit: 2019-06-05 | Discharge: 2019-06-05 | Disposition: A | Discharge status: Home / Self Care | Payer: BLUE CROSS/BLUE SHIELD | Source: Ambulatory Visit | Attending: Endocrinology | Admitting: Endocrinology

## 2019-06-05 DIAGNOSIS — Z85850 Personal history of malignant neoplasm of thyroid: Secondary | ICD-10-CM | POA: Diagnosis not present

## 2019-06-05 DIAGNOSIS — E890 Postprocedural hypothyroidism: Secondary | ICD-10-CM | POA: Diagnosis not present

## 2019-06-05 DIAGNOSIS — C73 Malignant neoplasm of thyroid gland: Secondary | ICD-10-CM

## 2019-06-18 DIAGNOSIS — Z1231 Encounter for screening mammogram for malignant neoplasm of breast: Secondary | ICD-10-CM | POA: Diagnosis not present

## 2019-07-24 MED FILL — MELOXICAM 15 MG TABLET: 15 | 30 days supply | Qty: 30 | Fill #2

## 2019-08-10 ENCOUNTER — Other Ambulatory Visit: Payer: Self-pay | Admitting: Obstetrics and Gynecology

## 2019-08-10 DIAGNOSIS — R1011 Right upper quadrant pain: Secondary | ICD-10-CM

## 2019-08-13 DIAGNOSIS — E890 Postprocedural hypothyroidism: Secondary | ICD-10-CM | POA: Diagnosis not present

## 2019-08-15 ENCOUNTER — Ambulatory Visit
Admission: RE | Admit: 2019-08-15 | Discharge: 2019-08-15 | Disposition: A | Discharge status: Home / Self Care | Payer: 59 | Source: Ambulatory Visit | Attending: Obstetrics and Gynecology | Admitting: Obstetrics and Gynecology

## 2019-08-15 DIAGNOSIS — K8020 Calculus of gallbladder without cholecystitis without obstruction: Secondary | ICD-10-CM | POA: Diagnosis not present

## 2019-08-15 DIAGNOSIS — R1011 Right upper quadrant pain: Secondary | ICD-10-CM

## 2019-08-28 MED FILL — SYNTHROID 175 MCG TABLET: 175 | 90 days supply | Qty: 90 | Fill #3

## 2019-09-07 DIAGNOSIS — M25532 Pain in left wrist: Secondary | ICD-10-CM | POA: Diagnosis not present

## 2019-09-07 DIAGNOSIS — S52552A Other extraarticular fracture of lower end of left radius, initial encounter for closed fracture: Secondary | ICD-10-CM | POA: Diagnosis not present

## 2019-09-12 DIAGNOSIS — K811 Chronic cholecystitis: Secondary | ICD-10-CM | POA: Diagnosis not present

## 2019-09-13 DIAGNOSIS — M25532 Pain in left wrist: Secondary | ICD-10-CM | POA: Diagnosis not present

## 2019-09-20 DIAGNOSIS — M25532 Pain in left wrist: Secondary | ICD-10-CM | POA: Diagnosis not present

## 2019-09-20 DIAGNOSIS — S52552D Other extraarticular fracture of lower end of left radius, subsequent encounter for closed fracture with routine healing: Secondary | ICD-10-CM | POA: Diagnosis not present

## 2019-09-25 NOTE — Progress Notes (Signed)
Please send orders. Pt is scheduled for her PAT appt on 09-26-19. Thank you.

## 2019-09-25 NOTE — Patient Instructions (Addendum)
DUE TO COVID-19 ONLY ONE VISITOR IS ALLOWED TO COME WITH YOU AND STAY IN THE WAITING ROOM ONLY DURING PRE OP AND PROCEDURE DAY OF SURGERY. THE 1 VISITOR MAY VISIT WITH YOU AFTER SURGERY IN YOUR PRIVATE ROOM DURING VISITING HOURS ONLY!  YOU NEED TO HAVE A COVID 19 TEST ON 09-27-19  @ 9:10 AM, THIS TEST MUST BE DONE BEFORE SURGERY, COME  [place], [place] , [address].  ([place]) ONCE YOUR COVID TEST IS COMPLETED, PLEASE BEGIN THE QUARANTINE INSTRUCTIONS AS OUTLINED IN YOUR HANDOUT.                Adiline Tokunaga  09/25/2019   Your procedure is scheduled on: 10-01-19   Report to  [facility] Main  Entrance    Report to Admitting at 1:25 PM     Call this number if you have problems the morning of surgery [phone]    Remember: Do not eat food or drink liquids :After Midnight.  You may have a Clear Liquid Diet from Midnight until 9:25 AM. After 9:25 AM, nothing until after surgery.     CLEAR LIQUID DIET   Foods Allowed                                                                     Foods Excluded  Coffee and tea, regular and decaf                             liquids that you cannot  Plain Jell-O any favor except red or purple                                           see through such as: Fruit ices (not with fruit pulp)                                     milk, soups, orange juice  Iced Popsicles                                    All solid food Carbonated beverages, regular and diet                                    Cranberry, grape and apple juices Sports drinks like Gatorade Lightly seasoned clear broth or consume(fat free) Sugar, honey syrup  Sample Menu Breakfast                                Lunch                                     Supper Cranberry juice  Beef broth                            Chicken broth Jell-O                                     Grape juice                           Apple juice Coffee or  tea                        Jell-O                                      Popsicle                                                Coffee or tea                        Coffee or tea  _____________________________________________________________________     Take these medicines the morning of surgery with A SIP OF WATER: Synthroid  BRUSH YOUR TEETH MORNING OF SURGERY AND RINSE YOUR MOUTH OUT, NO CHEWING GUM CANDY OR MINTS.                                You may not have any metal on your body including hair pins and              piercings     Do not wear jewelry, make-up, lotions, powders or perfumes, deodorant              Do not wear nail polish on your fingernails.  Do not shave  48 hours prior to surgery.              Do not bring valuables to the hospital. [place].  Contacts, dentures or bridgework may not be worn into surgery.       Patients discharged the day of surgery will not be allowed to drive home. IF YOU ARE HAVING SURGERY AND GOING HOME THE SAME DAY, YOU MUST HAVE AN ADULT TO DRIVE YOU HOME AND BE WITH YOU FOR 24 HOURS. YOU MAY GO HOME BY TAXI OR UBER OR ORTHERWISE, BUT AN ADULT MUST ACCOMPANY YOU HOME AND STAY WITH YOU FOR 24 HOURS.  Name and phone number of your driver: Chadsity Sowder [phone]  Special Instructions: N/A              Please read over the following fact sheets you were given: _____________________________________________________________________             [facility] - Preparing for Surgery Before surgery, you can play an important role.  Because skin is not sterile, your skin needs to be as free of germs as possible.  You can reduce the number of germs on your skin by washing with CHG (chlorahexidine  gluconate) soap before surgery.  CHG is an antiseptic cleaner which kills germs and bonds with the skin to continue killing germs even after washing. Please DO NOT use if you have an allergy to CHG  or antibacterial soaps.  If your skin becomes reddened/irritated stop using the CHG and inform your nurse when you arrive at Short Stay. Do not shave (including legs and underarms) for at least 48 hours prior to the first CHG shower.  You may shave your face/neck. Please follow these instructions carefully:  1.  Shower with CHG Soap the night before surgery and the  morning of Surgery.  2.  If you choose to wash your hair, wash your hair first as usual with your  normal  shampoo.  3.  After you shampoo, rinse your hair and body thoroughly to remove the  shampoo.                           4.  Use CHG as you would any other liquid soap.  You can apply chg directly  to the skin and wash                       Gently with a scrungie or clean washcloth.  5.  Apply the CHG Soap to your body ONLY FROM THE NECK DOWN.   Do not use on face/ open                           Wound or open sores. Avoid contact with eyes, ears mouth and genitals (private parts).                       Wash face,  Genitals (private parts) with your normal soap.             6.  Wash thoroughly, paying special attention to the area where your surgery  will be performed.  7.  Thoroughly rinse your body with warm water from the neck down.  8.  DO NOT shower/wash with your normal soap after using and rinsing off  the CHG Soap.                9.  Pat yourself dry with a clean towel.            10.  Wear clean pajamas.            11.  Place clean sheets on your bed the night of your first shower and do not  sleep with pets. Day of Surgery : Do not apply any lotions/deodorants the morning of surgery.  Please wear clean clothes to the hospital/surgery center.  FAILURE TO FOLLOW THESE INSTRUCTIONS MAY RESULT IN THE CANCELLATION OF YOUR SURGERY PATIENT SIGNATURE_________________________________  NURSE SIGNATURE__________________________________  ________________________________________________________________________

## 2019-09-26 ENCOUNTER — Other Ambulatory Visit: Payer: Self-pay

## 2019-09-26 ENCOUNTER — Encounter (HOSPITAL_COMMUNITY)
Admission: RE | Admit: 2019-09-26 | Discharge: 2019-09-26 | Disposition: A | Discharge status: Home / Self Care | Payer: 59 | Source: Ambulatory Visit | Attending: Surgery | Admitting: Surgery

## 2019-09-26 ENCOUNTER — Encounter (HOSPITAL_COMMUNITY): Payer: Self-pay

## 2019-09-26 DIAGNOSIS — Z01812 Encounter for preprocedural laboratory examination: Secondary | ICD-10-CM | POA: Diagnosis not present

## 2019-09-26 LAB — CBC
HCT: 36.3 % (ref 36.0–46.0)
Hemoglobin: 11.1 g/dL — ABNORMAL LOW (ref 12.0–15.0)
MCH: 25.9 pg — ABNORMAL LOW (ref 26.0–34.0)
MCHC: 30.6 g/dL (ref 30.0–36.0)
MCV: 84.6 fL (ref 80.0–100.0)
Platelets: 393 10*3/uL (ref 150–400)
RBC: 4.29 MIL/uL (ref 3.87–5.11)
RDW: 15.6 % — ABNORMAL HIGH (ref 11.5–15.5)
WBC: 6.5 10*3/uL (ref 4.0–10.5)
nRBC: 0 % (ref 0.0–0.2)

## 2019-09-27 ENCOUNTER — Other Ambulatory Visit (HOSPITAL_COMMUNITY)
Admission: RE | Admit: 2019-09-27 | Discharge: 2019-09-27 | Disposition: A | Discharge status: Home / Self Care | Payer: 59 | Source: Ambulatory Visit | Attending: Surgery | Admitting: Surgery

## 2019-09-27 DIAGNOSIS — Z20828 Contact with and (suspected) exposure to other viral communicable diseases: Secondary | ICD-10-CM | POA: Insufficient documentation

## 2019-09-27 DIAGNOSIS — Z01812 Encounter for preprocedural laboratory examination: Secondary | ICD-10-CM | POA: Insufficient documentation

## 2019-09-29 LAB — NOVEL CORONAVIRUS, NAA (HOSP ORDER, SEND-OUT TO REF LAB; TAT 18-24 HRS): SARS-CoV-2, NAA: NOT DETECTED

## 2019-09-30 NOTE — H&P (Signed)
Chief Complaint:  Symptomatic gallstnes  History of Present Illness:  [name] is an 38 y.o. female who has had gastric bypass with weight loss and development of symptomatic gallstones.  She has had a prior laparotomy for a large ovarian cyst with she was a teenager complicated by a left DVT and has had lap gastric bypass down at [facility].  She presents for lap chole.  She has had thyroidectomy by Dr. Harlow Asa for thyroid cancer  Past Medical History:  Diagnosis Date  . Abnormal Pap smear of cervix   . Anemia   . DVT of lower extremity (deep venous thrombosis) ([place]) 1998   due to compression from large ovarian cyst-HAS VENA CAVA FILTER IN PLACE  . HPV (human papilloma virus) infection   . Iron deficiency    CONSULT BY Dr. Beryle Beams - "PRESUMED [place]"  . Left thyroid nodule   . Obesity    S/P  Gastric Bypass  . Ovarian cyst, left    S/P excision large cyst  . Patient is a currently breast-feeding mother    DELIVERED BABY 01-01-14  . Polycystic ovarian syndrome     Past Surgical History:  Procedure Laterality Date  . gastric by-pass    . HYSTEROSCOPY    . LEFT OOPHORECTOMY  11/98  . ROUX-EN-Y PROCEDURE  10/2004  . THYROID LOBECTOMY Left 02/14/2014   Procedure: LEFT THYROID LOBECTOMY;  Surgeon: [name], MD;  Location: [facility];  Service: General;  Laterality: Left;  . THYROIDECTOMY N/A 02/21/2014   Procedure: COMPLETION THYROIDECTOMY;  Surgeon: [name], MD;  Location: [facility];  Service: General;  Laterality: N/A;  . VENA CAVA FILTER PLACEMENT  11/98   [place] filter    No current facility-administered medications for this encounter.    Current Outpatient Medications  Medication Sig Dispense Refill  . acetaminophen (TYLENOL) 500 MG tablet Take 1,000 mg by mouth daily as needed for headache.    . ibuprofen (ADVIL,MOTRIN) 600 MG tablet Take 1 tablet (600 mg total) by mouth every 6 (six) hours. 30  tablet 0  . SYNTHROID 100 MCG tablet Take 1 tablet (100 mcg total) by mouth daily. (Patient taking differently: Take 175 mcg by mouth daily. ) 30 tablet 3   Patient has no known allergies. Family History  Problem Relation Age of Onset  . Hypertension Mother   . Depression Mother   . Osteoporosis Mother   . Thyroid disease Mother        hypo  . Hypertension Father   . COPD Father   . Thyroid disease Father        hypo  . Sleep apnea Father   . Hypertension Maternal Grandmother   . Heart disease Maternal Grandmother   . Diabetes Maternal Grandmother   . Hypertension Maternal Grandfather   . Heart disease Maternal Grandfather   . Diabetes Maternal Grandfather   . Other Daughter        neurofibromatosis   Social History:   reports that she has never smoked. She has never used smokeless tobacco. She reports current alcohol use. She reports that she does not use drugs.   REVIEW OF SYSTEMS : Negative except for see problem list  Physical Exam:   Last menstrual period 09/23/2019. There is no height or weight on file to calculate BMI.  Gen:  WDWN WF NAD  Neurological: Alert and oriented to person, place, and time. Motor and sensory function is grossly intact  Head: Normocephalic and atraumatic.  Eyes: Conjunctivae are normal. Pupils are equal, round, and reactive to light. No scleral icterus.  Neck: Normal range of motion. Neck supple. No tracheal deviation or thyromegaly present.  Cardiovascular:  SR without murmurs or gallops.  No carotid bruits Breast:  Not examined Respiratory: Effort normal.  No respiratory distress. No chest wall tenderness. Breath sounds normal.  No wheezes, rales or rhonchi.  Abdomen:  nontender at present GU:  Not examined Musculoskeletal: Normal range of motion. Extremities are nontender. No cyanosis, edema or clubbing noted Lymphadenopathy: No cervical, preauricular, postauricular or axillary adenopathy is present Skin: Skin is warm and dry. No rash  noted. No diaphoresis. No erythema. No pallor. Pscyh: Normal mood and affect. Behavior is normal. Judgment and thought content normal.   LABORATORY RESULTS: No results found for this or any previous visit (from the past 48 hour(s)).   RADIOLOGY RESULTS: No results found.  Problem List: Patient Active Problem List   Diagnosis Date Noted  . Thyroid cancer ([place]) 02/21/2014  . Papillary carcinoma, follicular variant ([place]) 02/19/2014  . Papillary thyroid carcinoma ([place]) 02/19/2014  . Thyroid nodule 02/14/2014  . Left thyroid nodule 01/21/2014  . Normal pregnancy 01/01/2014  . NSVD (normal spontaneous vaginal delivery) 01/01/2014  . Morbid obesity ([place]) 04/16/2013  . Abnormal Pap smear of cervix   . HPV (human papilloma virus) infection   . Ovarian cyst, left   . Iron deficiency   . Obesity   . Polycystic ovarian syndrome   . DVT of lower extremity (deep venous thrombosis) ([facility])     Assessment & Plan: Gallstones plan lap chole.    Matt B. Hassell Done, MD, [facility] [phone] beeper [phone]  09/30/2019 4:52 PM

## 2019-10-01 ENCOUNTER — Ambulatory Visit (HOSPITAL_COMMUNITY)
Admission: RE | Admit: 2019-10-01 | Discharge: 2019-10-01 | Disposition: A | Discharge status: Home / Self Care | Payer: 59 | Source: Ambulatory Visit | Attending: Surgery | Admitting: Surgery

## 2019-10-01 ENCOUNTER — Ambulatory Visit (HOSPITAL_COMMUNITY): Payer: 59

## 2019-10-01 ENCOUNTER — Ambulatory Visit (HOSPITAL_COMMUNITY): Payer: 59 | Admitting: Physician Assistant

## 2019-10-01 ENCOUNTER — Ambulatory Visit (HOSPITAL_COMMUNITY): Payer: 59 | Admitting: Certified Registered Nurse Anesthetist

## 2019-10-01 ENCOUNTER — Encounter (HOSPITAL_COMMUNITY)
Admission: RE | Disposition: A | Discharge status: Home / Self Care | Payer: Self-pay | Source: Ambulatory Visit | Attending: Surgery

## 2019-10-01 ENCOUNTER — Encounter (HOSPITAL_COMMUNITY): Payer: Self-pay

## 2019-10-01 DIAGNOSIS — E890 Postprocedural hypothyroidism: Secondary | ICD-10-CM | POA: Diagnosis not present

## 2019-10-01 DIAGNOSIS — Z791 Long term (current) use of non-steroidal anti-inflammatories (NSAID): Secondary | ICD-10-CM | POA: Diagnosis not present

## 2019-10-01 DIAGNOSIS — I82409 Acute embolism and thrombosis of unspecified deep veins of unspecified lower extremity: Secondary | ICD-10-CM | POA: Diagnosis not present

## 2019-10-01 DIAGNOSIS — Z9884 Bariatric surgery status: Secondary | ICD-10-CM | POA: Insufficient documentation

## 2019-10-01 DIAGNOSIS — Z85850 Personal history of malignant neoplasm of thyroid: Secondary | ICD-10-CM | POA: Diagnosis not present

## 2019-10-01 DIAGNOSIS — K915 Postcholecystectomy syndrome: Secondary | ICD-10-CM | POA: Diagnosis not present

## 2019-10-01 DIAGNOSIS — Z6841 Body Mass Index (BMI) 40.0 and over, adult: Secondary | ICD-10-CM | POA: Diagnosis not present

## 2019-10-01 DIAGNOSIS — K8010 Calculus of gallbladder with chronic cholecystitis without obstruction: Secondary | ICD-10-CM | POA: Diagnosis not present

## 2019-10-01 DIAGNOSIS — E611 Iron deficiency: Secondary | ICD-10-CM | POA: Diagnosis not present

## 2019-10-01 DIAGNOSIS — K8020 Calculus of gallbladder without cholecystitis without obstruction: Secondary | ICD-10-CM | POA: Diagnosis present

## 2019-10-01 DIAGNOSIS — E039 Hypothyroidism, unspecified: Secondary | ICD-10-CM | POA: Diagnosis not present

## 2019-10-01 DIAGNOSIS — Z79890 Hormone replacement therapy (postmenopausal): Secondary | ICD-10-CM | POA: Insufficient documentation

## 2019-10-01 DIAGNOSIS — Z419 Encounter for procedure for purposes other than remedying health state, unspecified: Secondary | ICD-10-CM

## 2019-10-01 HISTORY — PX: CHOLECYSTECTOMY: SHX55

## 2019-10-01 LAB — PREGNANCY, URINE: Preg Test, Ur: NEGATIVE

## 2019-10-01 SURGERY — LAPAROSCOPIC CHOLECYSTECTOMY WITH INTRAOPERATIVE CHOLANGIOGRAM
Anesthesia: General | Site: Abdomen

## 2019-10-01 MED ORDER — FENTANYL CITRATE (PF) 100 MCG/2ML IJ SOLN
INTRAMUSCULAR | Status: DC | PRN
  Administered 2019-10-01 (×6): 50 ug via INTRAVENOUS

## 2019-10-01 MED ORDER — MIDAZOLAM HCL 2 MG/2ML IJ SOLN
INTRAMUSCULAR | Status: AC
  Filled 2019-10-01: qty 2

## 2019-10-01 MED ORDER — ONDANSETRON HCL 4 MG/2ML IJ SOLN
INTRAMUSCULAR | Status: AC
  Filled 2019-10-01: qty 2

## 2019-10-01 MED ORDER — DEXAMETHASONE SODIUM PHOSPHATE 10 MG/ML IJ SOLN
INTRAMUSCULAR | Status: AC
  Filled 2019-10-01: qty 1

## 2019-10-01 MED ORDER — SUGAMMADEX SODIUM 500 MG/5ML IV SOLN
INTRAVENOUS | Status: DC | PRN
  Administered 2019-10-01: 500 mg via INTRAVENOUS

## 2019-10-01 MED ORDER — FENTANYL CITRATE (PF) 100 MCG/2ML IJ SOLN
INTRAMUSCULAR | Status: AC
  Filled 2019-10-01: qty 2

## 2019-10-01 MED ORDER — OXYCODONE HCL 5 MG PO TABS
ORAL_TABLET | ORAL | Status: AC
  Administered 2019-10-01: 5 mg via ORAL
  Filled 2019-10-01: qty 1

## 2019-10-01 MED ORDER — ROCURONIUM BROMIDE 10 MG/ML (PF) SYRINGE
PREFILLED_SYRINGE | INTRAVENOUS | Status: AC
  Filled 2019-10-01: qty 10

## 2019-10-01 MED ORDER — SCOPOLAMINE 1 MG/3DAYS TD PT72
1.0000 | MEDICATED_PATCH | TRANSDERMAL | Status: DC
  Administered 2019-10-01: 1.5 mg via TRANSDERMAL
  Filled 2019-10-01: qty 1

## 2019-10-01 MED ORDER — FAMOTIDINE 20 MG PO TABS
20.0000 mg | ORAL_TABLET | Freq: Once | ORAL | Status: AC
  Administered 2019-10-01: 20 mg via ORAL
  Filled 2019-10-01: qty 1

## 2019-10-01 MED ORDER — HEPARIN SODIUM (PORCINE) 5000 UNIT/ML IJ SOLN
5000.0000 [IU] | Freq: Once | INTRAMUSCULAR | Status: AC
  Administered 2019-10-01: 5000 [IU] via SUBCUTANEOUS
  Filled 2019-10-01: qty 1

## 2019-10-01 MED ORDER — HYDROMORPHONE HCL 1 MG/ML IJ SOLN
0.2500 mg | INTRAMUSCULAR | Status: DC | PRN
  Administered 2019-10-01: 0.5 mg via INTRAVENOUS

## 2019-10-01 MED ORDER — LIDOCAINE 2% (20 MG/ML) 5 ML SYRINGE
INTRAMUSCULAR | Status: AC
  Filled 2019-10-01: qty 5

## 2019-10-01 MED ORDER — OXYCODONE HCL 5 MG PO TABS
5.0000 mg | ORAL_TABLET | Freq: Once | ORAL | Status: AC | PRN
  Administered 2019-10-01: 18:00:00 5 mg via ORAL

## 2019-10-01 MED ORDER — SUCCINYLCHOLINE CHLORIDE 200 MG/10ML IV SOSY
PREFILLED_SYRINGE | INTRAVENOUS | Status: DC | PRN
  Administered 2019-10-01: 130 mg via INTRAVENOUS

## 2019-10-01 MED ORDER — DEXAMETHASONE SODIUM PHOSPHATE 10 MG/ML IJ SOLN
INTRAMUSCULAR | Status: DC | PRN
  Administered 2019-10-01: 5 mg via INTRAVENOUS

## 2019-10-01 MED ORDER — MIDAZOLAM HCL 5 MG/5ML IJ SOLN
INTRAMUSCULAR | Status: DC | PRN
  Administered 2019-10-01: 2 mg via INTRAVENOUS

## 2019-10-01 MED ORDER — FENTANYL CITRATE (PF) 250 MCG/5ML IJ SOLN
INTRAMUSCULAR | Status: AC
  Filled 2019-10-01: qty 5

## 2019-10-01 MED ORDER — HYDROCODONE-ACETAMINOPHEN 5-325 MG PO TABS: 1.0000 | ORAL_TABLET | Freq: Four times a day (QID) | ORAL | 0 refills | Status: DC | PRN

## 2019-10-01 MED ORDER — ONDANSETRON HCL 4 MG/2ML IJ SOLN
INTRAMUSCULAR | Status: DC | PRN
  Administered 2019-10-01 (×2): 4 mg via INTRAVENOUS

## 2019-10-01 MED ORDER — ACETAMINOPHEN 500 MG PO TABS
1000.0000 mg | ORAL_TABLET | ORAL | Status: AC
  Administered 2019-10-01: 1000 mg via ORAL
  Filled 2019-10-01: qty 2

## 2019-10-01 MED ORDER — CHLORHEXIDINE GLUCONATE CLOTH 2 % EX PADS: 6.0000 | MEDICATED_PAD | Freq: Once | CUTANEOUS | Status: DC

## 2019-10-01 MED ORDER — PROPOFOL 10 MG/ML IV BOLUS
INTRAVENOUS | Status: AC
  Filled 2019-10-01: qty 20

## 2019-10-01 MED ORDER — BUPIVACAINE LIPOSOME 1.3 % IJ SUSP
20.0000 mL | Freq: Once | INTRAMUSCULAR | Status: DC
  Filled 2019-10-01: qty 20

## 2019-10-01 MED ORDER — LIDOCAINE 2% (20 MG/ML) 5 ML SYRINGE
INTRAMUSCULAR | Status: DC | PRN
  Administered 2019-10-01: 100 mg via INTRAVENOUS

## 2019-10-01 MED ORDER — PROPOFOL 500 MG/50ML IV EMUL
INTRAVENOUS | Status: AC
  Filled 2019-10-01: qty 50

## 2019-10-01 MED ORDER — KETOROLAC TROMETHAMINE 15 MG/ML IJ SOLN
INTRAMUSCULAR | Status: DC | PRN
  Administered 2019-10-01: 15 mg via INTRAVENOUS

## 2019-10-01 MED ORDER — SODIUM CHLORIDE 0.9 % IV SOLN
INTRAVENOUS | Status: DC | PRN
  Administered 2019-10-01: 7.5 mL

## 2019-10-01 MED ORDER — LACTATED RINGERS IV SOLN
INTRAVENOUS | Status: DC
  Administered 2019-10-01: 14:00:00 via INTRAVENOUS

## 2019-10-01 MED ORDER — PROPOFOL 500 MG/50ML IV EMUL
INTRAVENOUS | Status: DC | PRN
  Administered 2019-10-01: 25 ug/kg/min via INTRAVENOUS

## 2019-10-01 MED ORDER — DEXTROSE 5 % IV SOLN
3.0000 g | INTRAVENOUS | Status: AC
  Administered 2019-10-01: 16:00:00 3 g via INTRAVENOUS
  Filled 2019-10-01: qty 3

## 2019-10-01 MED ORDER — OXYCODONE HCL 5 MG/5ML PO SOLN: 5.0000 mg | Freq: Once | ORAL | Status: AC | PRN

## 2019-10-01 MED ORDER — HYDROMORPHONE HCL 1 MG/ML IJ SOLN
INTRAMUSCULAR | Status: AC
  Filled 2019-10-01: qty 1

## 2019-10-01 MED ORDER — BUPIVACAINE LIPOSOME 1.3 % IJ SUSP
INTRAMUSCULAR | Status: DC | PRN
  Administered 2019-10-01: 20 mL

## 2019-10-01 MED ORDER — PROPOFOL 10 MG/ML IV BOLUS
INTRAVENOUS | Status: DC | PRN
  Administered 2019-10-01: 200 mg via INTRAVENOUS

## 2019-10-01 MED ORDER — ROCURONIUM BROMIDE 50 MG/5ML IV SOSY
PREFILLED_SYRINGE | INTRAVENOUS | Status: DC | PRN
  Administered 2019-10-01: 10 mg via INTRAVENOUS
  Administered 2019-10-01: 50 mg via INTRAVENOUS

## 2019-10-01 MED ORDER — KETOROLAC TROMETHAMINE 30 MG/ML IJ SOLN: 30.0000 mg | Freq: Once | INTRAMUSCULAR | Status: DC | PRN

## 2019-10-01 MED ORDER — PROMETHAZINE HCL 25 MG/ML IJ SOLN: 6.2500 mg | INTRAMUSCULAR | Status: DC | PRN

## 2019-10-01 MED ORDER — LACTATED RINGERS IR SOLN
Status: DC | PRN
  Administered 2019-10-01: 1000 mL

## 2019-10-01 MED FILL — HYDROCODON-APAP 5-325: 5-325 | 3 days supply | Qty: 15 | Fill #0

## 2019-10-01 SURGICAL SUPPLY — 36 items
ADH SKN CLS APL DERMABOND .7 (GAUZE/BANDAGES/DRESSINGS) ×1
APPLIER CLIP ROT 10 11.4 M/L (STAPLE) ×2
APR CLP MED LRG 11.4X10 (STAPLE) ×1
BAG SPEC RTRVL 10 TROC 200 (ENDOMECHANICALS) ×1
CABLE HIGH FREQUENCY MONO STRZ (ELECTRODE) ×1 IMPLANT
CATH REDDICK CHOLANGI 4FR 50CM (CATHETERS) ×2 IMPLANT
CLIP APPLIE ROT 10 11.4 M/L (STAPLE) ×1 IMPLANT
COVER MAYO STAND STRL (DRAPES) ×2 IMPLANT
COVER SURGICAL LIGHT HANDLE (MISCELLANEOUS) ×2 IMPLANT
COVER WAND RF STERILE (DRAPES) IMPLANT
DERMABOND ADVANCED (GAUZE/BANDAGES/DRESSINGS) ×1
DERMABOND ADVANCED .7 DNX12 (GAUZE/BANDAGES/DRESSINGS) ×1 IMPLANT
DRAPE C-ARM 42X120 X-RAY (DRAPES) ×2 IMPLANT
ELECT PENCIL ROCKER SW 15FT (MISCELLANEOUS) ×1 IMPLANT
ELECT REM PT RETURN 15FT ADLT (MISCELLANEOUS) ×2 IMPLANT
GLOVE BIOGEL M 8.0 STRL (GLOVE) ×2 IMPLANT
GOWN STRL REUS W/TWL XL LVL3 (GOWN DISPOSABLE) ×6 IMPLANT
HEMOSTAT SURGICEL 4X8 (HEMOSTASIS) IMPLANT
IV CATH 14GX2 1/4 (CATHETERS) ×2 IMPLANT
KIT BASIN OR (CUSTOM PROCEDURE TRAY) ×2 IMPLANT
KIT TURNOVER KIT A (KITS) IMPLANT
L-HOOK LAP DISP 36CM (ELECTROSURGICAL) ×2
LHOOK LAP DISP 36CM (ELECTROSURGICAL) IMPLANT
POUCH RETRIEVAL ECOSAC 10 (ENDOMECHANICALS) IMPLANT
POUCH RETRIEVAL ECOSAC 10MM (ENDOMECHANICALS) ×1
SCISSORS LAP 5X45 EPIX DISP (ENDOMECHANICALS) ×2 IMPLANT
SET IRRIG TUBING LAPAROSCOPIC (IRRIGATION / IRRIGATOR) ×2 IMPLANT
SET TUBE SMOKE EVAC HIGH FLOW (TUBING) ×2 IMPLANT
SLEEVE XCEL OPT CAN 5 100 (ENDOMECHANICALS) ×2 IMPLANT
SUT MNCRL AB 4-0 PS2 18 (SUTURE) ×3 IMPLANT
SYR 20ML LL LF (SYRINGE) ×2 IMPLANT
TOWEL OR 17X26 10 PK STRL BLUE (TOWEL DISPOSABLE) ×2 IMPLANT
TRAY LAPAROSCOPIC (CUSTOM PROCEDURE TRAY) ×2 IMPLANT
TROCAR BLADELESS OPT 5 100 (ENDOMECHANICALS) ×2 IMPLANT
TROCAR XCEL BLUNT TIP 100MML (ENDOMECHANICALS) IMPLANT
TROCAR XCEL NON-BLD 11X100MML (ENDOMECHANICALS) ×2 IMPLANT

## 2019-10-01 NOTE — Anesthesia Postprocedure Evaluation (Signed)
Anesthesia Post Note  Patient: [name]  Procedure(s) Performed: LAPAROSCOPIC CHOLECYSTECTOMY WITH INTRAOPERATIVE CHOLANGIOGRAM (N/A Abdomen)     Patient location during evaluation: PACU Anesthesia Type: General Level of consciousness: awake and alert Pain management: pain level controlled Vital Signs Assessment: post-procedure vital signs reviewed and stable Respiratory status: spontaneous breathing, nonlabored ventilation, respiratory function stable and patient connected to nasal cannula oxygen Cardiovascular status: blood pressure returned to baseline and stable Postop Assessment: no apparent nausea or vomiting Anesthetic complications: no    Last Vitals:  Vitals:   10/01/19 1800 10/01/19 1830  BP: 140/90 (!) 144/92  Pulse: 70 71  Resp: 16 15  Temp: 36.8 C 36.8 C  SpO2: 98% 99%    Last Pain:  Vitals:   10/01/19 1830  TempSrc:   PainSc: 2                  Dimples Probus P Orvill Coulthard

## 2019-10-01 NOTE — Interval H&P Note (Signed)
History and Physical Interval Note:  10/01/2019 2:40 PM  [name]  has presented today for surgery, with the diagnosis of GALLSTONES AND ADHESIONS.  The various methods of treatment have been discussed with the patient and family. After consideration of risks, benefits and other options for treatment, the patient has consented to  Procedure(s): LAPAROSCOPIC CHOLECYSTECTOMY WITH INTRAOPERATIVE CHOLANGIOGRAM (N/A) as a surgical intervention.  The patient's history has been reviewed, patient examined, no change in status, stable for surgery.  I have reviewed the patient's chart and labs.  Questions were answered to the patient's satisfaction.     [name]

## 2019-10-01 NOTE — Transfer of Care (Signed)
Immediate Anesthesia Transfer of Care Note  Patient: [name]  Procedure(s) Performed: LAPAROSCOPIC CHOLECYSTECTOMY WITH INTRAOPERATIVE CHOLANGIOGRAM (N/A Abdomen)  Patient Location: PACU  Anesthesia Type:General  Level of Consciousness: awake, alert , oriented and patient cooperative  Airway & Oxygen Therapy: Patient Spontanous Breathing and Patient connected to face mask oxygen  Post-op Assessment: Report given to RN and Post -op Vital signs reviewed and stable  Post vital signs: Reviewed and stable  Last Vitals:  Vitals Value Taken Time  BP 143/84 10/01/19 1655  Temp    Pulse 85 10/01/19 1700  Resp 15 10/01/19 1700  SpO2 100 % 10/01/19 1700  Vitals shown include unvalidated device data.  Last Pain:  Vitals:   10/01/19 1358  TempSrc:   PainSc: 0-No pain         Complications: No apparent anesthesia complications

## 2019-10-01 NOTE — Anesthesia Procedure Notes (Signed)
Procedure Name: Intubation Date/Time: 10/01/2019 3:30 PM Performed by: West Pugh, CRNA Pre-anesthesia Checklist: Patient identified, Emergency Drugs available, Suction available, Patient being monitored and Timeout performed Patient Re-evaluated:Patient Re-evaluated prior to induction Oxygen Delivery Method: Circle system utilized Preoxygenation: Pre-oxygenation with 100% oxygen Induction Type: IV induction Ventilation: Mask ventilation without difficulty Laryngoscope Size: Mac and 4 Grade View: Grade I Tube type: Oral Tube size: 7.0 mm Number of attempts: 1 Airway Equipment and Method: Stylet Placement Confirmation: ETT inserted through vocal cords under direct vision,  positive ETCO2,  CO2 detector and breath sounds checked- equal and bilateral Secured at: 22 cm Tube secured with: Tape Dental Injury: Teeth and Oropharynx as per pre-operative assessment

## 2019-10-01 NOTE — Op Note (Signed)
[name]  Primary Care Physician:  Lanice Shirts, MD    10/01/2019  4:44 PM  Procedure: Laparoscopic Cholecystectomy with intraoperative cholangiogram  Surgeon: Catalina Antigua B. Hassell Done, MD, FACS Asst:  Fenton Malling. Lucia Gaskins, MD, FACS  Anes:  General  Drains:  None  Findings: Small stones in the cystic duct were milked out at time of cholangiogram; normal IOC-long tortuous cystic duct-chronic cholecystitis  Description of Procedure: The patient was taken to OR 2 and given general anesthesia.  The patient was prepped with Hibiclens and draped sterilely. A time out was performed.  Access to the abdomen was achieved with a 5 mm Optiview through the right upper quadrant.  Port placement included three 5 mm trocars and one 11 in the upper midline.    The gallbladder was visualized and the fundus was grasped and the gallbladder was elevated. Traction on the infundibulum allowed for successful demonstration of the critical view. Inflammatory changes were chronic and not too bad.  The cystic duct was identified and clipped up on the gallbladder and an incision was made in the cystic duct and the Reddick catheter was inserted after milking the cystic duct of any debris. There was a small yellow stone in the cystic duct.  A dynamic cholangiogram was performed which demonstrated a long tortuous cystic duct and intrahepatic filling and free flow into the duodenum.    The cystic duct was then triple clipped and divided, the cystic artery was double clipped and divided and then the gallbladder was removed from the gallbladder bed. Removal of the gallbladder from the gallbladder bed was straightforward.  The gallbladder was then placed in a bag and brought out through one of the trocar sites. The gallbladder bed was inspected and no bleeding or bile leaks were seen.    Incisions were injected with Exparel and closed with 4-0 Monocryl and Dermabond on the skin.  Sponge and needle count were correct.    The  patient was taken to the recovery room in satisfactory condition.

## 2019-10-01 NOTE — Discharge Instructions (Signed)
Laparoscopic Cholecystectomy °Laparoscopic cholecystectomy is surgery to remove the gallbladder. The gallbladder is a pear-shaped organ that lies beneath the liver on the right side of the body. The gallbladder stores bile, which is a fluid that helps the body to digest fats. Cholecystectomy is often done for inflammation of the gallbladder (cholecystitis). This condition is usually caused by a buildup of gallstones (cholelithiasis) in the gallbladder. Gallstones can block the flow of bile, which can result in inflammation and pain. In severe cases, emergency surgery may be required. °This procedure is done though small incisions in your abdomen (laparoscopic surgery). A thin scope with a camera (laparoscope) is inserted through one incision. Thin surgical instruments are inserted through the other incisions. In some cases, a laparoscopic procedure may be turned into a type of surgery that is done through a larger incision (open surgery). °Tell a health care provider about: °· Any allergies you have. °· All medicines you are taking, including vitamins, herbs, eye drops, creams, and over-the-counter medicines. °· Any problems you or family members have had with anesthetic medicines. °· Any blood disorders you have. °· Any surgeries you have had. °· Any medical conditions you have. °· Whether you are pregnant or may be pregnant. °What are the risks? °Generally, this is a safe procedure. However, problems may occur, including: °· Infection. °· Bleeding. °· Allergic reactions to medicines. °· Damage to other structures or organs. °· A stone remaining in the common bile duct. The common bile duct carries bile from the gallbladder into the small intestine. °· A bile leak from the cyst duct that is clipped when your gallbladder is removed. °What happens before the procedure? °Staying hydrated °Follow instructions from your health care provider about hydration, which may include: °· Up to 2 hours before the procedure - you  may continue to drink clear liquids, such as water, clear fruit juice, black coffee, and plain tea. °Eating and drinking restrictions °Follow instructions from your health care provider about eating and drinking, which may include: °· 8 hours before the procedure - stop eating heavy meals or foods such as meat, fried foods, or fatty foods. °· 6 hours before the procedure - stop eating light meals or foods, such as toast or cereal. °· 6 hours before the procedure - stop drinking milk or drinks that contain milk. °· 2 hours before the procedure - stop drinking clear liquids. °Medicines °· Ask your health care provider about: °? Changing or stopping your regular medicines. This is especially important if you are taking diabetes medicines or blood thinners. °? Taking medicines such as aspirin and ibuprofen. These medicines can thin your blood. Do not take these medicines before your procedure if your health care provider instructs you not to. °· You may be given antibiotic medicine to help prevent infection. °General instructions °· Let your health care provider know if you develop a cold or an infection before surgery. °· Plan to have someone take you home from the hospital or clinic. °· Ask your health care provider how your surgical site will be marked or identified. °What happens during the procedure? ° °· To reduce your risk of infection: °? Your health care team will wash or sanitize their hands. °? Your skin will be washed with soap. °? Hair may be removed from the surgical area. °· An IV tube may be inserted into one of your veins. °· You will be given one or more of the following: °? A medicine to help you relax (sedative). °? A   medicine to make you fall asleep (general anesthetic).  A breathing tube will be placed in your mouth.  Your surgeon will make several small cuts (incisions) in your abdomen.  The laparoscope will be inserted through one of the small incisions. The camera on the laparoscope will  send images to a TV screen (monitor) in the operating room. This lets your surgeon see inside your abdomen.  Air-like gas will be pumped into your abdomen. This will expand your abdomen to give the surgeon more room to perform the surgery.  Other tools that are needed for the procedure will be inserted through the other incisions. The gallbladder will be removed through one of the incisions.  Your common bile duct may be examined. If stones are found in the common bile duct, they may be removed.  After your gallbladder has been removed, the incisions will be closed with stitches (sutures), staples, or skin glue.  Your incisions may be covered with a bandage (dressing). The procedure may vary among health care providers and hospitals. What happens after the procedure?  Your blood pressure, heart rate, breathing rate, and blood oxygen level will be monitored until the medicines you were given have worn off.  You will be given medicines as needed to control your pain.  Do not drive for 24 hours if you were given a sedative. This information is not intended to replace advice given to you by your health care provider. Make sure you discuss any questions you have with your health care provider. Document Released: 11/29/2005 Document Revised: 11/11/2017 Document Reviewed: 05/17/2016 Elsevier Patient Education  2020 [place] [facility], Adult, Care After This sheet gives you information about how to care for yourself after your procedure. Your health care provider may also give you more specific instructions. If you have problems or questions, contact your health care provider. What can I expect after the procedure? After the procedure, the following side effects are common:  Pain or discomfort at the IV site.  Nausea.  Vomiting.  Sore throat.  Trouble concentrating.  Feeling cold or chills.  Weak or tired.  Sleepiness and fatigue.  Soreness and body aches. These  side effects can affect parts of the body that were not involved in surgery. Follow these instructions at home:  For at least 24 hours after the procedure:  Have a responsible adult stay with you. It is important to have someone help care for you until you are awake and alert.  Rest as needed.  Do not: ? Participate in activities in which you could fall or become injured. ? Drive. ? Use heavy machinery. ? Drink alcohol. ? Take sleeping pills or medicines that cause drowsiness. ? Make important decisions or sign legal documents. ? Take care of children on your own. Eating and drinking  Follow any instructions from your health care provider about eating or drinking restrictions.  When you feel hungry, start by eating small amounts of foods that are soft and easy to digest (bland), such as toast. Gradually return to your regular diet.  Drink enough fluid to keep your urine pale yellow.  If you vomit, rehydrate by drinking water, juice, or clear broth. General instructions  If you have sleep apnea, surgery and certain medicines can increase your risk for breathing problems. Follow instructions from your health care provider about wearing your sleep device: ? Anytime you are sleeping, including during daytime naps. ? While taking prescription pain medicines, sleeping medicines, or medicines that make you  drowsy.  Return to your normal activities as told by your health care provider. Ask your health care provider what activities are safe for you.  Take over-the-counter and prescription medicines only as told by your health care provider.  If you smoke, do not smoke without supervision.  Keep all follow-up visits as told by your health care provider. This is important. Contact a health care provider if:  You have nausea or vomiting that does not get better with medicine.  You cannot eat or drink without vomiting.  You have pain that does not get better with medicine.  You are  unable to pass urine.  You develop a skin rash.  You have a fever.  You have redness around your IV site that gets worse. Get help right away if:  You have difficulty breathing.  You have chest pain.  You have blood in your urine or stool, or you vomit blood. Summary  After the procedure, it is common to have a sore throat or nausea. It is also common to feel tired.  Have a responsible adult stay with you for the first 24 hours after general anesthesia. It is important to have someone help care for you until you are awake and alert.  When you feel hungry, start by eating small amounts of foods that are soft and easy to digest (bland), such as toast. Gradually return to your regular diet.  Drink enough fluid to keep your urine pale yellow.  Return to your normal activities as told by your health care provider. Ask your health care provider what activities are safe for you. This information is not intended to replace advice given to you by your health care provider. Make sure you discuss any questions you have with your health care provider. Document Released: 03/07/2001 Document Revised: 12/02/2017 Document Reviewed: 07/15/2017 Elsevier Patient Education  2020 [employer].

## 2019-10-01 NOTE — Anesthesia Preprocedure Evaluation (Addendum)
Anesthesia Evaluation  Patient identified by MRN, date of birth, ID band Patient awake    Reviewed: Allergy & Precautions, NPO status , Patient's Chart, lab work & pertinent test results  Airway Mallampati: I  TM Distance: >3 FB Neck ROM: Full    Dental no notable dental hx.    Pulmonary neg pulmonary ROS,    Pulmonary exam normal breath sounds clear to auscultation       Cardiovascular negative cardio ROS Normal cardiovascular exam Rhythm:Regular Rate:Normal     Neuro/Psych negative neurological ROS  negative psych ROS   GI/Hepatic negative GI ROS, Neg liver ROS,   Endo/Other  Hypothyroidism Morbid obesity (SUPER)  Renal/GU negative Renal ROS     Musculoskeletal negative musculoskeletal ROS (+)   Abdominal (+) + obese,   Peds  Hematology negative hematology ROS (+)   Anesthesia Other Findings GALLSTONES AND ADHESIONS  Reproductive/Obstetrics                            Anesthesia Physical Anesthesia Plan  ASA: IV  Anesthesia Plan: General   Post-op Pain Management:    Induction: Intravenous  PONV Risk Score and Plan: 4 or greater and Scopolamine patch - Pre-op, Midazolam, Dexamethasone, Ondansetron and Treatment may vary due to age or medical condition  Airway Management Planned: Oral ETT  Additional Equipment:   Intra-op Plan:   Post-operative Plan: Extubation in OR  Informed Consent: I have reviewed the patients History and Physical, chart, labs and discussed the procedure including the risks, benefits and alternatives for the proposed anesthesia with the patient or authorized representative who has indicated his/her understanding and acceptance.     Dental advisory given  Plan Discussed with: CRNA  Anesthesia Plan Comments:        Anesthesia Quick Evaluation

## 2019-10-02 ENCOUNTER — Encounter (HOSPITAL_COMMUNITY): Payer: Self-pay | Admitting: Surgery

## 2019-10-03 LAB — SURGICAL PATHOLOGY

## 2019-10-04 DIAGNOSIS — M25532 Pain in left wrist: Secondary | ICD-10-CM | POA: Diagnosis not present

## 2019-10-31 DIAGNOSIS — M25532 Pain in left wrist: Secondary | ICD-10-CM | POA: Diagnosis not present

## 2019-12-04 MED FILL — SYNTHROID 175 MCG TABLET: 175 | 90 days supply | Qty: 90 | Fill #4

## 2019-12-27 ENCOUNTER — Ambulatory Visit: Payer: 59

## 2020-03-05 MED FILL — SYNTHROID 175 MCG TABLET: 175 | 90 days supply | Qty: 90 | Fill #5

## 2020-03-06 DIAGNOSIS — Z6841 Body Mass Index (BMI) 40.0 and over, adult: Secondary | ICD-10-CM | POA: Diagnosis not present

## 2020-03-06 DIAGNOSIS — Z1151 Encounter for screening for human papillomavirus (HPV): Secondary | ICD-10-CM | POA: Diagnosis not present

## 2020-03-06 DIAGNOSIS — Z124 Encounter for screening for malignant neoplasm of cervix: Secondary | ICD-10-CM | POA: Diagnosis not present

## 2020-03-06 DIAGNOSIS — Z130 Encounter for screening for diseases of the blood and blood-forming organs and certain disorders involving the immune mechanism: Secondary | ICD-10-CM | POA: Diagnosis not present

## 2020-03-06 DIAGNOSIS — C73 Malignant neoplasm of thyroid gland: Secondary | ICD-10-CM | POA: Diagnosis not present

## 2020-03-06 DIAGNOSIS — E890 Postprocedural hypothyroidism: Secondary | ICD-10-CM | POA: Diagnosis not present

## 2020-03-06 DIAGNOSIS — Z1389 Encounter for screening for other disorder: Secondary | ICD-10-CM | POA: Diagnosis not present

## 2020-03-06 DIAGNOSIS — Z0000 Encounter for general adult medical examination without abnormal findings: Secondary | ICD-10-CM | POA: Diagnosis not present

## 2020-03-06 DIAGNOSIS — Z01419 Encounter for gynecological examination (general) (routine) without abnormal findings: Secondary | ICD-10-CM | POA: Diagnosis not present

## 2020-03-17 ENCOUNTER — Other Ambulatory Visit (HOSPITAL_COMMUNITY): Payer: Self-pay | Admitting: Endocrinology

## 2020-03-17 DIAGNOSIS — E890 Postprocedural hypothyroidism: Secondary | ICD-10-CM | POA: Diagnosis not present

## 2020-03-17 DIAGNOSIS — C73 Malignant neoplasm of thyroid gland: Secondary | ICD-10-CM | POA: Diagnosis not present

## 2020-03-17 MED FILL — LIOTHYRONINE SODIUM 5 MCG T: 5 | 30 days supply | Qty: 30 | Fill #0

## 2020-03-17 MED FILL — SYNTHROID 150 MCG TABLET: 150 | 30 days supply | Qty: 30 | Fill #0

## 2020-03-19 DIAGNOSIS — L814 Other melanin hyperpigmentation: Secondary | ICD-10-CM | POA: Diagnosis not present

## 2020-04-10 MED FILL — LIOTHYRONINE SODIUM 5 MCG T: 5 | 30 days supply | Qty: 30 | Fill #1

## 2020-04-10 MED FILL — SYNTHROID 150 MCG TABLET: 150 | 30 days supply | Qty: 30 | Fill #1

## 2020-04-23 DIAGNOSIS — L814 Other melanin hyperpigmentation: Secondary | ICD-10-CM | POA: Diagnosis not present

## 2020-04-23 DIAGNOSIS — D485 Neoplasm of uncertain behavior of skin: Secondary | ICD-10-CM | POA: Diagnosis not present

## 2020-04-23 DIAGNOSIS — D2239 Melanocytic nevi of other parts of face: Secondary | ICD-10-CM | POA: Diagnosis not present

## 2020-04-23 DIAGNOSIS — D225 Melanocytic nevi of trunk: Secondary | ICD-10-CM | POA: Diagnosis not present

## 2020-04-23 DIAGNOSIS — L578 Other skin changes due to chronic exposure to nonionizing radiation: Secondary | ICD-10-CM | POA: Diagnosis not present

## 2020-04-28 DIAGNOSIS — C73 Malignant neoplasm of thyroid gland: Secondary | ICD-10-CM | POA: Diagnosis not present

## 2020-05-12 MED FILL — LIOTHYRONINE SODIUM 5 MCG T: 5 | 30 days supply | Qty: 30 | Fill #2

## 2020-05-20 DIAGNOSIS — C73 Malignant neoplasm of thyroid gland: Secondary | ICD-10-CM | POA: Diagnosis not present

## 2020-05-20 DIAGNOSIS — E890 Postprocedural hypothyroidism: Secondary | ICD-10-CM | POA: Diagnosis not present

## 2020-06-13 MED FILL — LIOTHYRONINE SODIUM 5 MCG T: 5 | 30 days supply | Qty: 30 | Fill #3

## 2020-06-23 MED FILL — SYNTHROID 150 MCG TABLET: 150 | 30 days supply | Qty: 30 | Fill #3

## 2020-06-24 DIAGNOSIS — Z1231 Encounter for screening mammogram for malignant neoplasm of breast: Secondary | ICD-10-CM | POA: Diagnosis not present

## 2020-07-14 MED FILL — LIOTHYRONINE SODIUM 5 MCG T: 5 | 30 days supply | Qty: 30 | Fill #4

## 2020-07-21 MED FILL — SYNTHROID 150 MCG TABLET: 150 | 30 days supply | Qty: 30 | Fill #4

## 2020-08-12 ENCOUNTER — Other Ambulatory Visit (HOSPITAL_COMMUNITY): Payer: Self-pay | Admitting: Endocrinology

## 2020-08-19 MED FILL — SYNTHROID 150 MCG TABLET: 150 | 90 days supply | Qty: 90 | Fill #0

## 2020-08-19 MED FILL — LIOTHYRONINE SODIUM 5 MCG T: 5 | 30 days supply | Qty: 30 | Fill #5

## 2020-09-15 ENCOUNTER — Ambulatory Visit: Payer: 59 | Attending: Internal Medicine

## 2020-09-15 DIAGNOSIS — Z23 Encounter for immunization: Secondary | ICD-10-CM

## 2020-09-15 MED FILL — LIOTHYRONINE SODIUM 5 MCG T: 5 | 30 days supply | Qty: 30 | Fill #6

## 2020-09-15 NOTE — Progress Notes (Signed)
   Covid-19 Vaccination Clinic  Name:  [name]    MRN: [id number] DOB: 1981/12/02  09/15/2020  [name] was observed post Covid-19 immunization for 15 minutes without incident. She was provided with Vaccine Information Sheet and instruction to access the V-Safe system.   [name] was instructed to call 911 with any severe reactions post vaccine: Marland Kitchen Difficulty breathing  . Swelling of face and throat  . A fast heartbeat  . A bad rash all over body  . Dizziness and weakness

## 2020-10-15 MED FILL — LIOTHYRONINE SODIUM 5 MCG T: 5 | 30 days supply | Qty: 30 | Fill #7

## 2020-10-17 DIAGNOSIS — F321 Major depressive disorder, single episode, moderate: Secondary | ICD-10-CM | POA: Diagnosis not present

## 2020-10-21 DIAGNOSIS — F321 Major depressive disorder, single episode, moderate: Secondary | ICD-10-CM | POA: Diagnosis not present

## 2020-11-03 DIAGNOSIS — F321 Major depressive disorder, single episode, moderate: Secondary | ICD-10-CM | POA: Diagnosis not present

## 2020-11-16 MED FILL — LIOTHYRONINE SODIUM 5 MCG T: 5 | 30 days supply | Qty: 30 | Fill #8

## 2020-11-17 MED FILL — SYNTHROID 150 MCG TABLET: 150 | 90 days supply | Qty: 90 | Fill #1

## 2020-12-11 DIAGNOSIS — F321 Major depressive disorder, single episode, moderate: Secondary | ICD-10-CM | POA: Diagnosis not present

## 2020-12-15 MED FILL — LIOTHYRONINE SODIUM 5 MCG T: 5 | 30 days supply | Qty: 30 | Fill #9

## 2021-01-13 MED FILL — LIOTHYRONINE SODIUM 5 MCG T: 5 | 30 days supply | Qty: 30 | Fill #10

## 2021-01-20 DIAGNOSIS — R102 Pelvic and perineal pain: Secondary | ICD-10-CM | POA: Diagnosis not present

## 2021-02-11 MED FILL — LIOTHYRONINE SODIUM 5 MCG T: 5 | 30 days supply | Qty: 30 | Fill #11

## 2021-02-11 MED FILL — SYNTHROID 150 MCG TABLET: 150 | 90 days supply | Qty: 90 | Fill #2

## 2021-03-10 DIAGNOSIS — C73 Malignant neoplasm of thyroid gland: Secondary | ICD-10-CM | POA: Diagnosis not present

## 2021-03-10 DIAGNOSIS — E890 Postprocedural hypothyroidism: Secondary | ICD-10-CM | POA: Diagnosis not present

## 2021-03-17 ENCOUNTER — Other Ambulatory Visit (HOSPITAL_COMMUNITY): Payer: Self-pay

## 2021-03-17 DIAGNOSIS — C73 Malignant neoplasm of thyroid gland: Secondary | ICD-10-CM | POA: Diagnosis not present

## 2021-03-17 DIAGNOSIS — E890 Postprocedural hypothyroidism: Secondary | ICD-10-CM | POA: Diagnosis not present

## 2021-03-17 MED ORDER — LIOTHYRONINE SODIUM 5 MCG PO TABS
ORAL_TABLET | ORAL | 4 refills | Status: DC
  Filled 2021-03-17: qty 90, 90d supply, fill #0
  Filled 2021-06-15: qty 90, 90d supply, fill #1
  Filled 2021-09-08: qty 90, 90d supply, fill #2
  Filled 2021-12-08: qty 90, 90d supply, fill #3
  Filled 2022-03-03: qty 90, 90d supply, fill #4

## 2021-03-17 MED ORDER — SYNTHROID 150 MCG PO TABS
ORAL_TABLET | ORAL | 4 refills | Status: DC
  Filled 2021-03-17: qty 90, 90d supply, fill #0

## 2021-03-18 DIAGNOSIS — Z01419 Encounter for gynecological examination (general) (routine) without abnormal findings: Secondary | ICD-10-CM | POA: Diagnosis not present

## 2021-03-18 DIAGNOSIS — Z130 Encounter for screening for diseases of the blood and blood-forming organs and certain disorders involving the immune mechanism: Secondary | ICD-10-CM | POA: Diagnosis not present

## 2021-03-18 DIAGNOSIS — R87612 Low grade squamous intraepithelial lesion on cytologic smear of cervix (LGSIL): Secondary | ICD-10-CM | POA: Diagnosis not present

## 2021-03-18 DIAGNOSIS — Z6841 Body Mass Index (BMI) 40.0 and over, adult: Secondary | ICD-10-CM | POA: Diagnosis not present

## 2021-03-18 DIAGNOSIS — Z1389 Encounter for screening for other disorder: Secondary | ICD-10-CM | POA: Diagnosis not present

## 2021-03-18 DIAGNOSIS — D649 Anemia, unspecified: Secondary | ICD-10-CM | POA: Diagnosis not present

## 2021-03-18 DIAGNOSIS — Z124 Encounter for screening for malignant neoplasm of cervix: Secondary | ICD-10-CM | POA: Diagnosis not present

## 2021-03-18 DIAGNOSIS — Z1151 Encounter for screening for human papillomavirus (HPV): Secondary | ICD-10-CM | POA: Diagnosis not present

## 2021-03-20 LAB — HM PAP SMEAR: HM Pap smear: NORMAL

## 2021-04-03 ENCOUNTER — Other Ambulatory Visit: Payer: Self-pay

## 2021-04-03 ENCOUNTER — Non-Acute Institutional Stay (HOSPITAL_COMMUNITY)
Admission: RE | Admit: 2021-04-03 | Discharge: 2021-04-03 | Disposition: A | Discharge status: Home / Self Care | Payer: 59 | Source: Ambulatory Visit | Attending: Internal Medicine | Admitting: Internal Medicine

## 2021-04-03 DIAGNOSIS — D649 Anemia, unspecified: Secondary | ICD-10-CM | POA: Insufficient documentation

## 2021-04-03 MED ORDER — SODIUM CHLORIDE 0.9 % IV SOLN
INTRAVENOUS | Status: DC | PRN
  Administered 2021-04-03: 250 mL via INTRAVENOUS

## 2021-04-03 MED ORDER — SODIUM CHLORIDE 0.9 % IV SOLN
510.0000 mg | INTRAVENOUS | Status: DC
  Administered 2021-04-03: 510 mg via INTRAVENOUS
  Filled 2021-04-03: qty 510

## 2021-04-03 NOTE — Progress Notes (Signed)
Patient received IV Feraheme as ordered by Paula Compton, MD. Observed for at least 30 minutes post infusion.Tolerated well, vitals stable, discharge instructions given, verbalized understanding. Patient alert, oriented and ambulatory at the time of discharge.

## 2021-04-03 NOTE — Discharge Instructions (Signed)
Ferumoxytol injection What is this medicine? FERUMOXYTOL is an iron complex. Iron is used to make healthy red blood cells, which carry oxygen and nutrients throughout the body. This medicine is used to treat iron deficiency anemia. This medicine may be used for other purposes; ask your health care provider or pharmacist if you have questions. COMMON BRAND NAME(S): Feraheme What should I tell my health care provider before I take this medicine? They need to know if you have any of these conditions:  anemia not caused by low iron levels  high levels of iron in the blood  magnetic resonance imaging (MRI) test scheduled  an unusual or allergic reaction to iron, other medicines, foods, dyes, or preservatives  pregnant or trying to get pregnant  breast-feeding How should I use this medicine? This medicine is for injection into a vein. It is given by a health care professional in a hospital or clinic setting. Talk to your pediatrician regarding the use of this medicine in children. Special care may be needed. Overdosage: If you think you have taken too much of this medicine contact a poison control center or emergency room at once. NOTE: This medicine is only for you. Do not share this medicine with others. What if I miss a dose? It is important not to miss your dose. Call your doctor or health care professional if you are unable to keep an appointment. What may interact with this medicine? This medicine may interact with the following medications:  other iron products This list may not describe all possible interactions. Give your health care provider a list of all the medicines, herbs, non-prescription drugs, or dietary supplements you use. Also tell them if you smoke, drink alcohol, or use illegal drugs. Some items may interact with your medicine. What should I watch for while using this medicine? Visit your doctor or healthcare professional regularly. Tell your doctor or healthcare  professional if your symptoms do not start to get better or if they get worse. You may need blood work done while you are taking this medicine. You may need to follow a special diet. Talk to your doctor. Foods that contain iron include: whole grains/cereals, dried fruits, beans, or peas, leafy green vegetables, and organ meats (liver, kidney). What side effects may I notice from receiving this medicine? Side effects that you should report to your doctor or health care professional as soon as possible:  allergic reactions like skin rash, itching or hives, swelling of the face, lips, or tongue  breathing problems  changes in blood pressure  feeling faint or lightheaded, falls  fever or chills  flushing, sweating, or hot feelings  swelling of the ankles or feet Side effects that usually do not require medical attention (report to your doctor or health care professional if they continue or are bothersome):  diarrhea  headache  nausea, vomiting  stomach pain This list may not describe all possible side effects. Call your doctor for medical advice about side effects. You may report side effects to FDA at 1-800-FDA-1088. Where should I keep my medicine? This drug is given in a hospital or clinic and will not be stored at home. NOTE: This sheet is a summary. It may not cover all possible information. If you have questions about this medicine, talk to your doctor, pharmacist, or health care provider.  2021 Elsevier/Gold Standard (2017-01-17 20:21:10)  

## 2021-04-06 DIAGNOSIS — R140 Abdominal distension (gaseous): Secondary | ICD-10-CM | POA: Diagnosis not present

## 2021-04-06 DIAGNOSIS — Z6841 Body Mass Index (BMI) 40.0 and over, adult: Secondary | ICD-10-CM | POA: Diagnosis not present

## 2021-04-06 DIAGNOSIS — N879 Dysplasia of cervix uteri, unspecified: Secondary | ICD-10-CM | POA: Diagnosis not present

## 2021-04-06 DIAGNOSIS — N870 Mild cervical dysplasia: Secondary | ICD-10-CM | POA: Diagnosis not present

## 2021-04-10 ENCOUNTER — Non-Acute Institutional Stay (HOSPITAL_COMMUNITY)
Admission: RE | Admit: 2021-04-10 | Discharge: 2021-04-10 | Disposition: A | Discharge status: Home / Self Care | Payer: 59 | Source: Ambulatory Visit | Attending: Internal Medicine | Admitting: Internal Medicine

## 2021-04-10 ENCOUNTER — Other Ambulatory Visit: Payer: Self-pay

## 2021-04-10 DIAGNOSIS — D649 Anemia, unspecified: Secondary | ICD-10-CM | POA: Diagnosis not present

## 2021-04-10 DIAGNOSIS — Z9884 Bariatric surgery status: Secondary | ICD-10-CM | POA: Diagnosis not present

## 2021-04-10 MED ORDER — SODIUM CHLORIDE 0.9 % IV SOLN
INTRAVENOUS | Status: DC | PRN
  Administered 2021-04-10: 250 mL via INTRAVENOUS

## 2021-04-10 MED ORDER — SODIUM CHLORIDE 0.9 % IV SOLN
510.0000 mg | Freq: Once | INTRAVENOUS | Status: AC
  Administered 2021-04-10: 510 mg via INTRAVENOUS
  Filled 2021-04-10: qty 510

## 2021-04-10 NOTE — Discharge Instructions (Signed)
Ferumoxytol injection What is this medicine? FERUMOXYTOL is an iron complex. Iron is used to make healthy red blood cells, which carry oxygen and nutrients throughout the body. This medicine is used to treat iron deficiency anemia. This medicine may be used for other purposes; ask your health care provider or pharmacist if you have questions. COMMON BRAND NAME(S): Feraheme What should I tell my health care provider before I take this medicine? They need to know if you have any of these conditions:  anemia not caused by low iron levels  high levels of iron in the blood  magnetic resonance imaging (MRI) test scheduled  an unusual or allergic reaction to iron, other medicines, foods, dyes, or preservatives  pregnant or trying to get pregnant  breast-feeding How should I use this medicine? This medicine is for injection into a vein. It is given by a health care professional in a hospital or clinic setting. Talk to your pediatrician regarding the use of this medicine in children. Special care may be needed. Overdosage: If you think you have taken too much of this medicine contact a poison control center or emergency room at once. NOTE: This medicine is only for you. Do not share this medicine with others. What if I miss a dose? It is important not to miss your dose. Call your doctor or health care professional if you are unable to keep an appointment. What may interact with this medicine? This medicine may interact with the following medications:  other iron products This list may not describe all possible interactions. Give your health care provider a list of all the medicines, herbs, non-prescription drugs, or dietary supplements you use. Also tell them if you smoke, drink alcohol, or use illegal drugs. Some items may interact with your medicine. What should I watch for while using this medicine? Visit your doctor or healthcare professional regularly. Tell your doctor or healthcare  professional if your symptoms do not start to get better or if they get worse. You may need blood work done while you are taking this medicine. You may need to follow a special diet. Talk to your doctor. Foods that contain iron include: whole grains/cereals, dried fruits, beans, or peas, leafy green vegetables, and organ meats (liver, kidney). What side effects may I notice from receiving this medicine? Side effects that you should report to your doctor or health care professional as soon as possible:  allergic reactions like skin rash, itching or hives, swelling of the face, lips, or tongue  breathing problems  changes in blood pressure  feeling faint or lightheaded, falls  fever or chills  flushing, sweating, or hot feelings  swelling of the ankles or feet Side effects that usually do not require medical attention (report to your doctor or health care professional if they continue or are bothersome):  diarrhea  headache  nausea, vomiting  stomach pain This list may not describe all possible side effects. Call your doctor for medical advice about side effects. You may report side effects to FDA at 1-800-FDA-1088. Where should I keep my medicine? This drug is given in a hospital or clinic and will not be stored at home. NOTE: This sheet is a summary. It may not cover all possible information. If you have questions about this medicine, talk to your doctor, pharmacist, or health care provider.  2021 Elsevier/Gold Standard (2017-01-17 20:21:10)  

## 2021-04-10 NOTE — Progress Notes (Signed)
Patient received the second dose of  IV Feraheme as ordered by Paula Compton, MD. Observed for at least 30 minutes post infusion.Tolerated well, vitals stable, discharge instructions given, verbalized understanding. Alert, oriented and ambulatory at the time of discharge.

## 2021-04-22 DIAGNOSIS — D225 Melanocytic nevi of trunk: Secondary | ICD-10-CM | POA: Diagnosis not present

## 2021-04-22 DIAGNOSIS — L814 Other melanin hyperpigmentation: Secondary | ICD-10-CM | POA: Diagnosis not present

## 2021-04-22 DIAGNOSIS — D2239 Melanocytic nevi of other parts of face: Secondary | ICD-10-CM | POA: Diagnosis not present

## 2021-05-17 MED FILL — Levothyroxine Sodium Tab 150 MCG: ORAL | 90 days supply | Qty: 90 | Fill #0 | Status: AC

## 2021-05-18 ENCOUNTER — Other Ambulatory Visit (HOSPITAL_COMMUNITY): Payer: Self-pay

## 2021-05-27 ENCOUNTER — Other Ambulatory Visit (HOSPITAL_COMMUNITY): Payer: Self-pay | Admitting: Obstetrics and Gynecology

## 2021-05-27 DIAGNOSIS — M79605 Pain in left leg: Secondary | ICD-10-CM

## 2021-05-28 ENCOUNTER — Telehealth: Payer: Self-pay | Admitting: Hematology and Oncology

## 2021-05-28 ENCOUNTER — Ambulatory Visit (HOSPITAL_COMMUNITY)
Admission: RE | Admit: 2021-05-28 | Discharge: 2021-05-28 | Disposition: A | Discharge status: Home / Self Care | Payer: 59 | Source: Ambulatory Visit | Attending: Obstetrics and Gynecology | Admitting: Obstetrics and Gynecology

## 2021-05-28 ENCOUNTER — Other Ambulatory Visit (HOSPITAL_COMMUNITY): Payer: Self-pay | Admitting: Obstetrics and Gynecology

## 2021-05-28 ENCOUNTER — Ambulatory Visit (HOSPITAL_BASED_OUTPATIENT_CLINIC_OR_DEPARTMENT_OTHER)
Admission: RE | Admit: 2021-05-28 | Discharge: 2021-05-28 | Disposition: A | Discharge status: Home / Self Care | Payer: 59 | Source: Ambulatory Visit | Attending: Obstetrics and Gynecology | Admitting: Obstetrics and Gynecology

## 2021-05-28 ENCOUNTER — Other Ambulatory Visit (HOSPITAL_COMMUNITY): Payer: Self-pay

## 2021-05-28 ENCOUNTER — Other Ambulatory Visit: Payer: Self-pay

## 2021-05-28 DIAGNOSIS — M7989 Other specified soft tissue disorders: Secondary | ICD-10-CM | POA: Diagnosis not present

## 2021-05-28 DIAGNOSIS — M79605 Pain in left leg: Secondary | ICD-10-CM | POA: Insufficient documentation

## 2021-05-28 DIAGNOSIS — I82402 Acute embolism and thrombosis of unspecified deep veins of left lower extremity: Secondary | ICD-10-CM

## 2021-05-28 MED ORDER — ELIQUIS DVT/PE STARTER PACK 5 MG PO TBPK
ORAL_TABLET | ORAL | 0 refills | Status: DC
  Filled 2021-05-28: qty 74, 30d supply, fill #0

## 2021-05-28 NOTE — Progress Notes (Signed)
Lower extremity venous LT and IVC/iliac study completed.  Preliminary results relayed to Marvel Plan, MD.  See CV Proc for preliminary results report.   Darlin Coco, RDMS, RVT

## 2021-05-28 NOTE — Telephone Encounter (Signed)
Scheduled appt per 6/16 referral. Pt aware.

## 2021-05-29 ENCOUNTER — Other Ambulatory Visit (HOSPITAL_COMMUNITY): Payer: Self-pay

## 2021-06-04 IMAGING — RF DG CHOLANGIOGRAM OPERATIVE
1 series · 5 of 5 positions shown · non-contrast
Comparison: 08/15/2019

CLINICAL DATA: Elective cholecystectomy

EXAM:
INTRAOPERATIVE CHOLANGIOGRAM
TECHNIQUE: Cholangiographic images from the C-arm fluoroscopic device were
submitted for interpretation post-operatively. Please see the
procedural report for the amount of contrast and the fluoroscopy
time utilized.

[Series 1: run · 2 acquisitions, 5 frames shown]
[im 1/2]
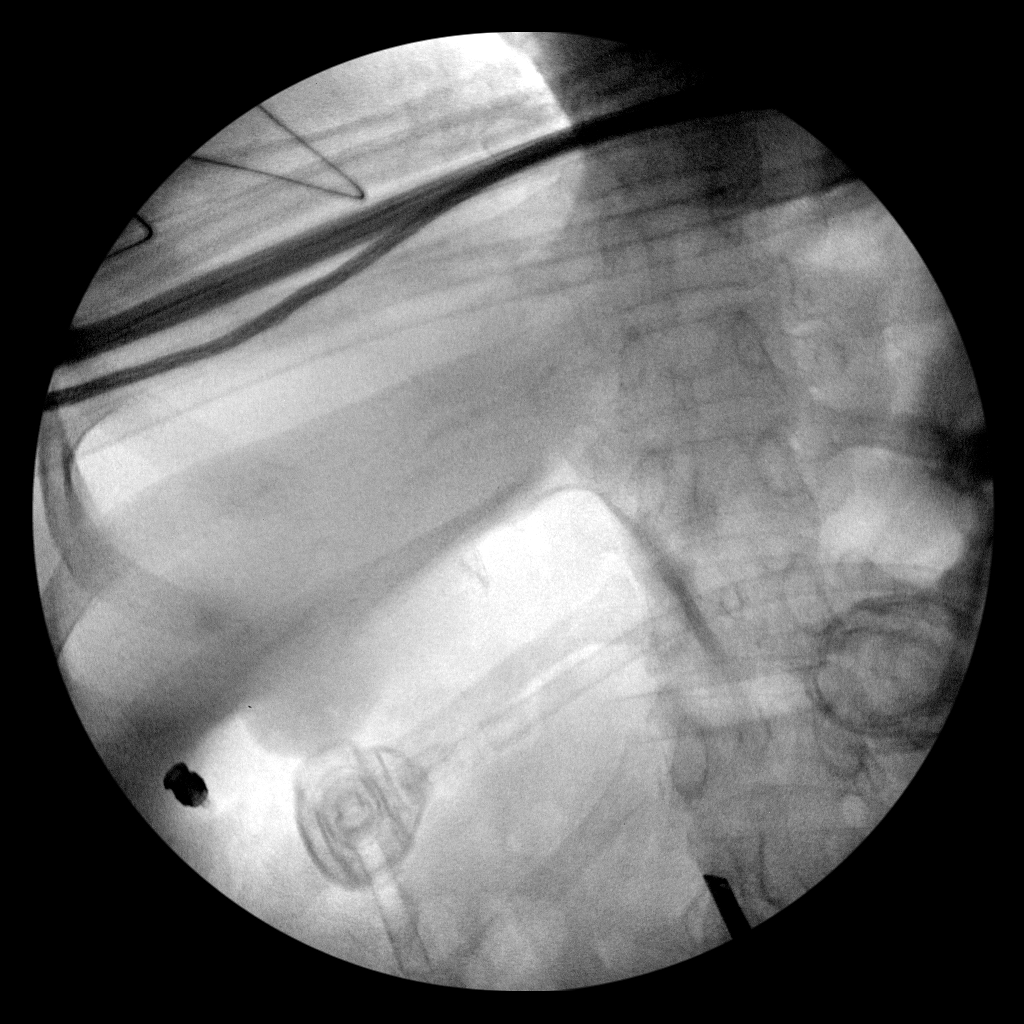
[im 2/2]
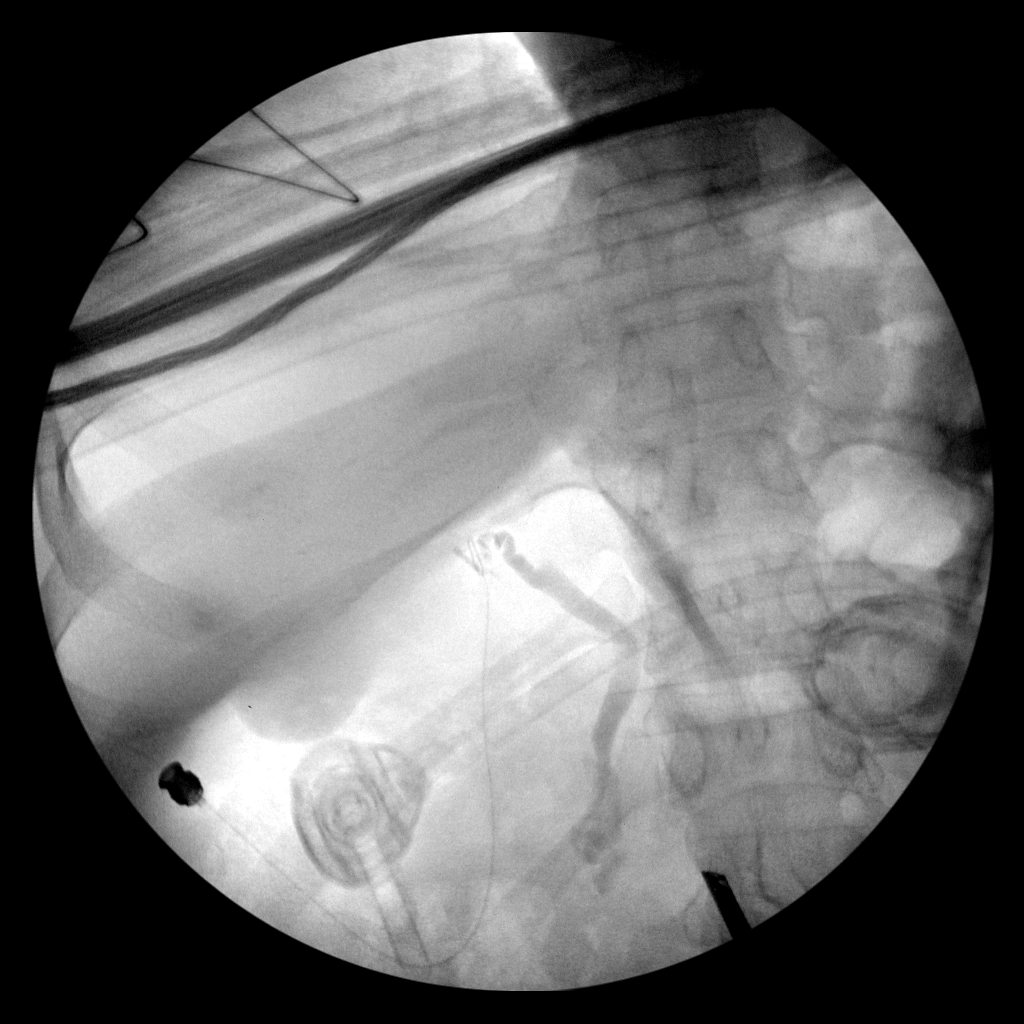
[im 2/2]
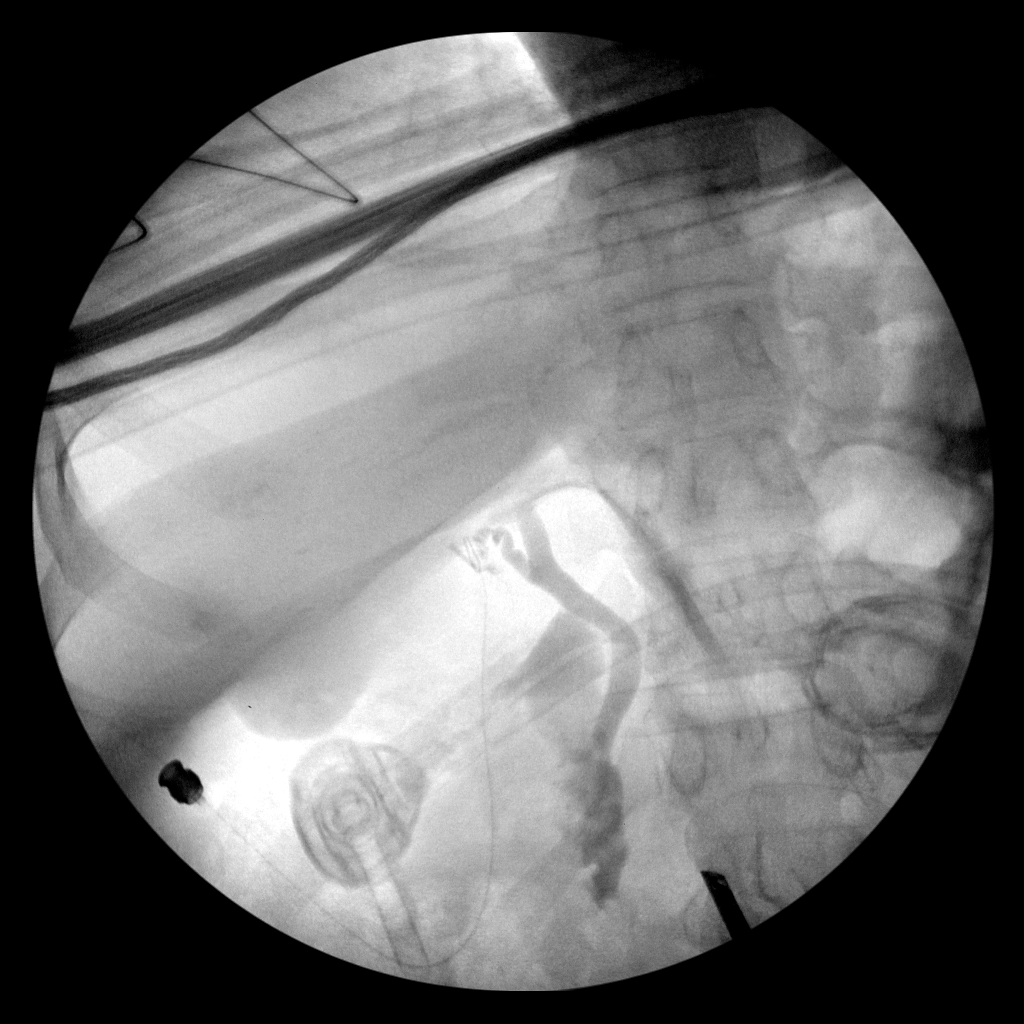
[im 2/2]
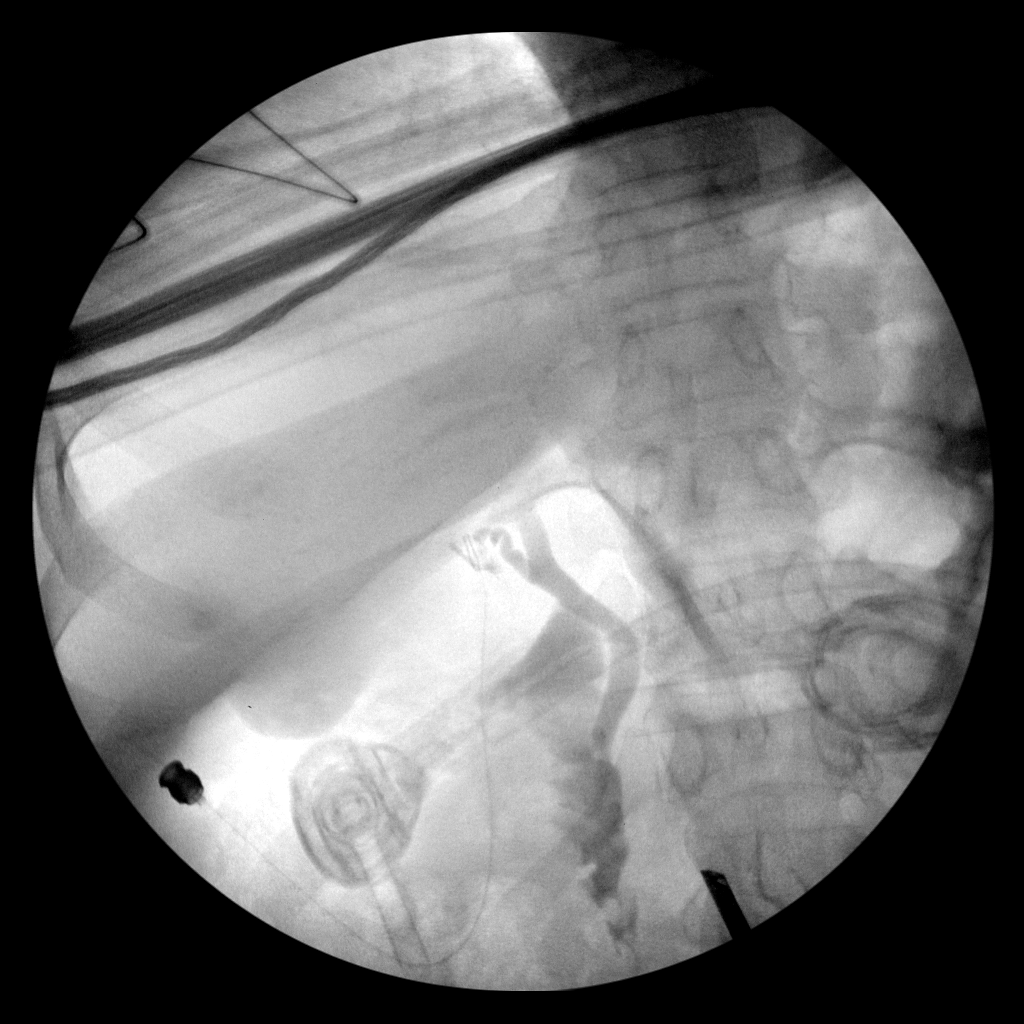
[im 2/2]
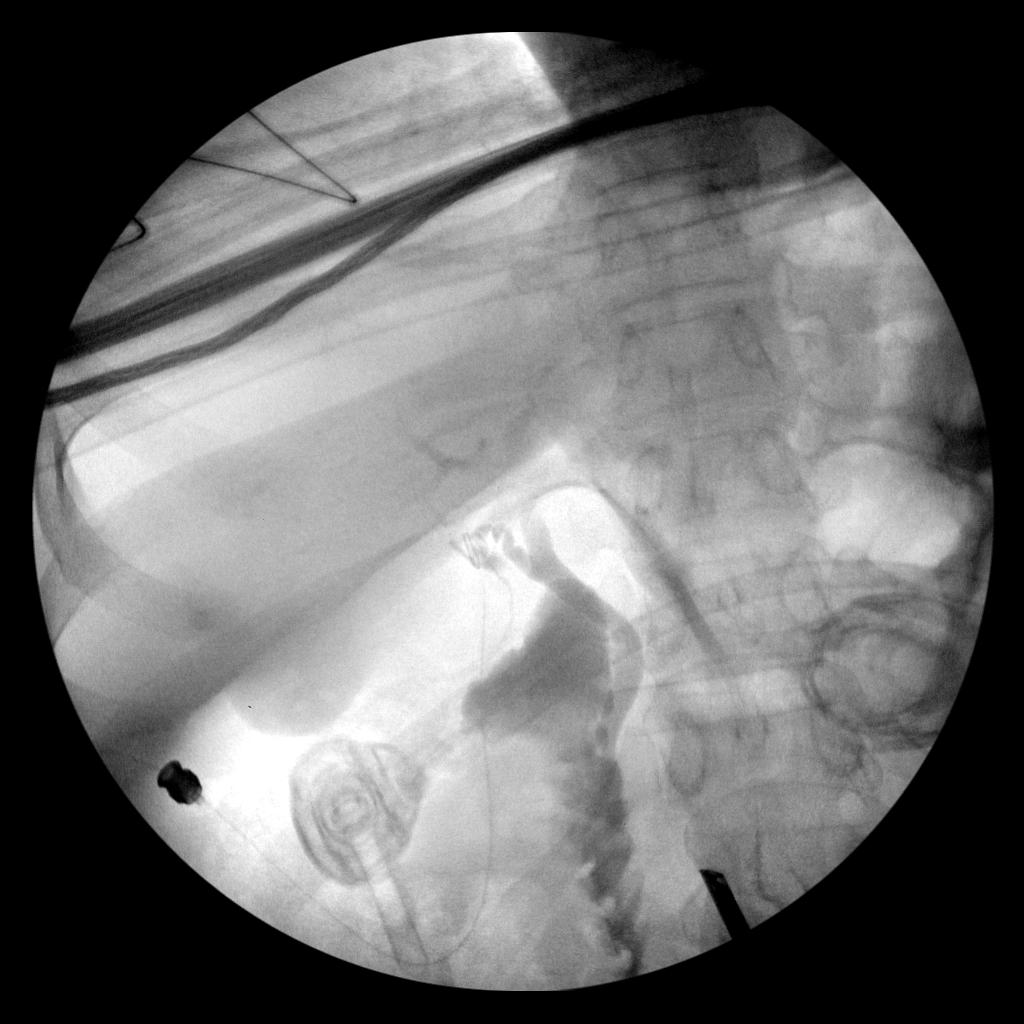

[5 of 5 positions shown; findings below may reference images not displayed]

FINDINGS: Intraoperative cholangiogram performed during laparoscopic
procedure. The residual cystic duct, common hepatic duct, and common
bile duct are patent. Contrast easily drains into the duodenum. No
dilatation or obstruction. Leakage at the cystic duct injection site
noted.
IMPRESSION: Patent biliary system.

## 2021-06-15 NOTE — Progress Notes (Signed)
[facility] CONSULT NOTE  Patient Care Team: Lanice Shirts, MD as PCP - General (Internal Medicine)  CHIEF COMPLAINTS/PURPOSE OF CONSULTATION:  Newly diagnosed acute embolism and thrombosis of unspecified deep veins of left  lower extremity  HISTORY OF PRESENTING ILLNESS:  [name] 40 y.o. female is here because of recent diagnosis of acute embolism and thrombosis of unspecified deep veins of left  lower extremity. Labs on 05/28/21 showed WBC 8.9, RBC 4.19, HGB 10.1, HCT 32.3,  and PLT 425. She presents to the clinic today for initial evaluation and discussion of treatment options.  She had a prior history of iliac vein blood clots about 22 years ago and at that time had extensive work-up including evaluation for inherited risk factors and she was told that she did not have any of those.  She also had an IVC filter for a brief time and it was removed.  She had thrombolysis performed at that time.  They went to her popliteal vein for doing these procedures.  She has not had any blood clots for the last 22 years.  Dr. Paula Compton also has treated her iron deficiency anemia with IV iron therapy in April 2022.  She has a prior history of gastric bypass surgery and what was felt that the iron deficiency was related to malabsorption.  B12 was not checked at that time.  I reviewed her records extensively and collaborated the history with the patient.  MEDICAL HISTORY:  Past Medical History:  Diagnosis Date   Abnormal Pap smear of cervix    Anemia    DVT of lower extremity (deep venous thrombosis) ([place]) 1998   due to compression from large ovarian cyst-HAS VENA CAVA FILTER IN PLACE   HPV (human papilloma virus) infection    Iron deficiency    CONSULT BY Dr. Beryle Beams - "PRESUMED [employer]"   Left thyroid nodule    Obesity    S/P  Gastric Bypass   Ovarian cyst, left    S/P excision large cyst    Patient is a currently breast-feeding mother    DELIVERED BABY 01-01-14   Polycystic ovarian syndrome     SURGICAL HISTORY: Past Surgical History:  Procedure Laterality Date   CHOLECYSTECTOMY N/A 10/01/2019   Procedure: LAPAROSCOPIC CHOLECYSTECTOMY WITH INTRAOPERATIVE CHOLANGIOGRAM;  Surgeon: Johnathan Hausen, MD;  Location: [facility];  Service: General;  Laterality: N/A;   gastric by-pass     HYSTEROSCOPY     LEFT OOPHORECTOMY  11/98   ROUX-EN-Y PROCEDURE  10/2004   THYROID LOBECTOMY Left 02/14/2014   Procedure: LEFT THYROID LOBECTOMY;  Surgeon: [name], MD;  Location: [facility];  Service: General;  Laterality: Left;   THYROIDECTOMY N/A 02/21/2014   Procedure: COMPLETION THYROIDECTOMY;  Surgeon: [name], MD;  Location: [facility];  Service: General;  Laterality: N/A;   VENA CAVA FILTER PLACEMENT  11/98   [place] filter    SOCIAL HISTORY: Social History   Socioeconomic History   Marital status: Married    Spouse name: Not on file   Number of children: Not on file   Years of education: Not on file   Highest education level: Not on file  Occupational History   Not on file  Tobacco Use   Smoking status: Never   Smokeless tobacco: Never  Substance and Sexual Activity   Alcohol use: Yes    Comment: occas   Drug use: No   Sexual activity: Yes  Other Topics Concern   Not on file  Social History Narrative   Not on file   Social Determinants of Health   Financial Resource Strain: Not on file  Food Insecurity: Not on file  Transportation Needs: Not on file  Physical Activity: Not on file  Stress: Not on file  Social Connections: Not on file  Intimate Partner Violence: Not on file    FAMILY HISTORY: Family History  Problem Relation Age of Onset   Hypertension Mother    Depression Mother    Osteoporosis Mother    Thyroid disease Mother        hypo   Hypertension Father    COPD Father    Thyroid disease Father        hypo   Sleep apnea Father    Hypertension  Maternal Grandmother    Heart disease Maternal Grandmother    Diabetes Maternal Grandmother    Hypertension Maternal Grandfather    Heart disease Maternal Grandfather    Diabetes Maternal Grandfather    Other Daughter        neurofibromatosis    ALLERGIES:  has no allergies on file.  MEDICATIONS:  Current Outpatient Medications  Medication Sig Dispense Refill   acetaminophen (TYLENOL) 500 MG tablet Take 1,000 mg by mouth daily as needed for headache.     Apixaban Starter Pack, 10mg  and 5mg , (ELIQUIS DVT/PE STARTER PACK) Take as directed 2 tablets by mouth twice a day for 7 days then decrease to 1 tablet by mouth twice a day thereafter. 74 tablet 0   HYDROcodone-acetaminophen (NORCO/VICODIN) 5-325 MG tablet Take 1 tablet by mouth every 6 (six) hours as needed for moderate pain. 15 tablet 0   ibuprofen (ADVIL,MOTRIN) 600 MG tablet Take 1 tablet (600 mg total) by mouth every 6 (six) hours. 30 tablet 0   liothyronine (CYTOMEL) 5 MCG tablet TAKE 1 TABLET BY MOUTH ON AN EMPTY STOMACH ONCE A DAY 30 tablet 12   liothyronine (CYTOMEL) 5 MCG tablet Take 1 tablet by mouth daily on an empty stomach 90 tablet 4   SYNTHROID 100 MCG tablet Take 1 tablet (100 mcg total) by mouth daily. (Patient taking differently: Take 175 mcg by mouth daily. ) 30 tablet 3   SYNTHROID 150 MCG tablet TAKE 1 TABLET BY MOUTH IN THE MORNING ON AN EMPTY STOMACH 90 tablet 4   SYNTHROID 150 MCG tablet Take 1 tablet by mouth on an empty stomach once a day 90 tablet 4   No current facility-administered medications for this visit.    REVIEW OF SYSTEMS:   Left leg swelling has markedly decreased. All other systems were reviewed with the patient and are negative.  PHYSICAL EXAMINATION: ECOG PERFORMANCE STATUS: 1 - Symptomatic but completely ambulatory  Vitals:   06/16/21 1551  BP: 112/76  Resp: 18  Temp: 97.8 F (36.6 C)  SpO2: 100%   Filed Weights   06/16/21 1551  Weight: 296 lb 9 oz (134.5 kg)    Examination:  Mild left leg swelling LABORATORY DATA:  I have reviewed the data as listed Lab Results  Component Value Date   WBC 6.5 09/26/2019   HGB 11.1 (L) 09/26/2019   HCT 36.3 09/26/2019   MCV 84.6 09/26/2019   PLT 393 09/26/2019   Lab Results  Component Value Date   NA 142 02/22/2014   K 4.6 02/22/2014   CL 105 02/22/2014   CO2 25 02/22/2014    RADIOGRAPHIC STUDIES: I have personally reviewed the radiological reports and  agreed with the findings in the report.  ASSESSMENT AND PLAN:  Acute deep vein thrombosis (DVT) of popliteal vein of left lower extremity ([place]) 05/28/2021: Acute DVT left popliteal vein, age-indeterminate DVT left posterior tibial vein and left peroneal vein  Current treatment: Eliquis  I discussed with the patient risk factors for blood clots. Since she had a previous work-up for inherited risk factors and was not found to have any, I did not recommend repeating the entire work-up.  Workup recommended: Lupus anticoagulant/antiphospholipid antibody testing Return to clinic next Monday to discuss results.  Iron deficiency anemia due to malabsorption from prior gastric bypass: Previously received IV iron therapy in April 2022 with Dr. Marvel Plan. We will check iron levels and ferritin today. Since patient's of gastric bypass surgery become B12 deficient as well, we will check B12 levels as well.  I will call her next Monday with the results of these tests. She works as an Retail banker at [employer].  All questions were answered. The patient knows to call the clinic with any problems, questions or concerns.   Rulon Eisenmenger, MD, MPH 06/16/2021    I, Thana Ates, am acting as scribe for Nicholas Lose, MD.

## 2021-06-16 ENCOUNTER — Other Ambulatory Visit (HOSPITAL_COMMUNITY): Payer: Self-pay

## 2021-06-16 ENCOUNTER — Inpatient Hospital Stay: Payer: 59 | Attending: Hematology and Oncology | Admitting: Hematology and Oncology

## 2021-06-16 ENCOUNTER — Other Ambulatory Visit: Payer: Self-pay

## 2021-06-16 ENCOUNTER — Inpatient Hospital Stay: Payer: 59

## 2021-06-16 VITALS — BP 112/76 | Temp 97.8°F | Resp 18 | Ht 64.0 in | Wt 296.6 lb

## 2021-06-16 DIAGNOSIS — D509 Iron deficiency anemia, unspecified: Secondary | ICD-10-CM

## 2021-06-16 DIAGNOSIS — Z9884 Bariatric surgery status: Secondary | ICD-10-CM | POA: Insufficient documentation

## 2021-06-16 DIAGNOSIS — D508 Other iron deficiency anemias: Secondary | ICD-10-CM | POA: Diagnosis not present

## 2021-06-16 DIAGNOSIS — Z86718 Personal history of other venous thrombosis and embolism: Secondary | ICD-10-CM | POA: Insufficient documentation

## 2021-06-16 DIAGNOSIS — I82442 Acute embolism and thrombosis of left tibial vein: Secondary | ICD-10-CM | POA: Insufficient documentation

## 2021-06-16 DIAGNOSIS — I82432 Acute embolism and thrombosis of left popliteal vein: Secondary | ICD-10-CM

## 2021-06-16 DIAGNOSIS — Z7901 Long term (current) use of anticoagulants: Secondary | ICD-10-CM | POA: Insufficient documentation

## 2021-06-16 DIAGNOSIS — R5383 Other fatigue: Secondary | ICD-10-CM | POA: Diagnosis not present

## 2021-06-16 DIAGNOSIS — I82452 Acute embolism and thrombosis of left peroneal vein: Secondary | ICD-10-CM | POA: Insufficient documentation

## 2021-06-16 LAB — CBC WITH DIFFERENTIAL (CANCER CENTER ONLY)
Abs Immature Granulocytes: 0.02 10*3/uL (ref 0.00–0.07)
Basophils Absolute: 0.1 10*3/uL (ref 0.0–0.1)
Basophils Relative: 1 %
Eosinophils Absolute: 0.1 10*3/uL (ref 0.0–0.5)
Eosinophils Relative: 1 %
HCT: 39.5 % (ref 36.0–46.0)
Hemoglobin: 12.8 g/dL (ref 12.0–15.0)
Immature Granulocytes: 0 %
Lymphocytes Relative: 30 %
Lymphs Abs: 2.8 10*3/uL (ref 0.7–4.0)
MCH: 28.3 pg (ref 26.0–34.0)
MCHC: 32.4 g/dL (ref 30.0–36.0)
MCV: 87.2 fL (ref 80.0–100.0)
Monocytes Absolute: 0.7 10*3/uL (ref 0.1–1.0)
Monocytes Relative: 8 %
Neutro Abs: 5.5 10*3/uL (ref 1.7–7.7)
Neutrophils Relative %: 60 %
Platelet Count: 346 10*3/uL (ref 150–400)
RBC: 4.53 MIL/uL (ref 3.87–5.11)
RDW: 17.2 % — ABNORMAL HIGH (ref 11.5–15.5)
WBC Count: 9.2 10*3/uL (ref 4.0–10.5)
nRBC: 0 % (ref 0.0–0.2)

## 2021-06-16 LAB — VITAMIN B12: Vitamin B-12: 181 pg/mL (ref 180–914)

## 2021-06-16 NOTE — Assessment & Plan Note (Signed)
05/28/2021: Acute DVT left popliteal vein, age-indeterminate DVT left posterior tibial vein and left peroneal vein  Current treatment: Eliquis  I discussed with the patient risk factors for blood clots.  Inherited risk factors include: 1. Factor V Leiden mutation 2. Prothrombin gene [id number] 3. Protein S deficiency  4. Protein C deficiency  5. Antithrombin deficiency  Acquired risk factors include: 1. Antiphospholipid antibody syndrome 2. Tobacco use 3. Obesity 4. Medications including oral contraceptives 5. Sedentary behavior including postoperative state 6. Foreign bodies in circulation  Workup recommended: Bloodwork to evaluate for the 5 inherited factors mentioned above along with antiphospholipid antibodies. Return to clinic in one to 2 weeks to discuss the results of the tests

## 2021-06-16 NOTE — Addendum Note (Signed)
Addended by: Rennis Harding on: 06/16/2021 04:47 PM   Modules accepted: Orders

## 2021-06-17 ENCOUNTER — Telehealth: Payer: Self-pay | Admitting: Hematology and Oncology

## 2021-06-17 LAB — FERRITIN: Ferritin: 25 ng/mL (ref 11–307)

## 2021-06-17 LAB — CARDIOLIPIN ANTIBODIES, IGG, IGM, IGA
Anticardiolipin IgA: <9 APL U/mL (ref 0–11)
Anticardiolipin IgG: <9 GPL U/mL (ref 0–14)
Anticardiolipin IgM: <9 MPL U/mL (ref 0–12)

## 2021-06-17 LAB — BETA-2-GLYCOPROTEIN I ABS, IGG/M/A
Beta-2 Glyco I IgG: <9 GPI IgG units (ref 0–20)
Beta-2-Glycoprotein I IgA: <9 GPI IgA units (ref 0–25)
Beta-2-Glycoprotein I IgM: <9 GPI IgM units (ref 0–32)

## 2021-06-17 LAB — IRON AND TIBC
Iron: 72 ug/dL (ref 41–142)
Saturation Ratios: 18 % — ABNORMAL LOW (ref 21–57)
TIBC: 398 ug/dL (ref 236–444)
UIBC: 326 ug/dL (ref 120–384)

## 2021-06-17 LAB — LUPUS ANTICOAGULANT PANEL
DRVVT: 45 s (ref 0.0–47.0)
PTT Lupus Anticoagulant: 31.1 s (ref 0.0–51.9)

## 2021-06-17 NOTE — Telephone Encounter (Signed)
Scheduled appointment per 07/05 los. Patient is aware. 

## 2021-06-21 NOTE — Progress Notes (Signed)
  HEMATOLOGY-ONCOLOGY TELEPHONE VISIT PROGRESS NOTE  I connected with [name] on 06/22/2021 at  2:30 PM EDT by telephone and verified that I am speaking with the correct person using two identifiers.  I discussed the limitations, risks, security and privacy concerns of performing an evaluation and management service by telephone and the availability of in person appointments.  I also discussed with the patient that there may be a patient responsible charge related to this service. The patient expressed understanding and agreed to proceed.   History of Present Illness: [name] is a 40 y.o. female with above-mentioned history of acute embolism and thrombosis of unspecified deep veins of left lower extremity. Labs on 06/16/21 showed RBC 4.53, HGB 12.8, HCT 39.5,  PLT 346, Vit B12 181, Iron sat 18%, and Ferritin 25. She reports via telephone today for follow-up.   Observations/Objective:     Assessment Plan:  Acute deep vein thrombosis (DVT) of popliteal vein of left lower extremity ([place]) 05/28/2021: Acute DVT left popliteal vein, age-indeterminate DVT left posterior tibial vein and left peroneal vein   Current treatment: Eliquis (I sent a new prescription for 5 mg p.o. twice daily of Eliquis for the next 5 months) Labs:  06/26/21: Lupus anticoagulant/antiphospholipid antibody testing: No Lupus anticoagulant detected B12: 181 Ferritin: 25, Iron Sat: 18%, Hb 12.8  B12 deficiency: Encouraged her to take 2000 mcg of B12 sublingually daily. We will recheck B12 levels when she comes back in 5 months.  Labs in 6 months and follow-up the day after with a telephone visit.  I discussed the assessment and treatment plan with the patient. The patient was provided an opportunity to ask questions and all were answered. The patient agreed with the plan and demonstrated an understanding of the instructions. The patient was advised to call back or seek an in-person evaluation if the symptoms  worsen or if the condition fails to improve as anticipated.   Total time spent: 11 mins including non-face to face time and time spent for planning, charting and coordination of care  Rulon Eisenmenger, MD 06/22/2021    I, Thana Ates, am acting as scribe for Nicholas Lose, MD.  I have reviewed the above documentation for accuracy and completeness, and I agree with the above.

## 2021-06-22 ENCOUNTER — Encounter: Payer: Self-pay | Admitting: Hematology and Oncology

## 2021-06-22 ENCOUNTER — Inpatient Hospital Stay (HOSPITAL_BASED_OUTPATIENT_CLINIC_OR_DEPARTMENT_OTHER): Payer: 59 | Admitting: Hematology and Oncology

## 2021-06-22 ENCOUNTER — Other Ambulatory Visit (HOSPITAL_COMMUNITY): Payer: Self-pay

## 2021-06-22 DIAGNOSIS — I82432 Acute embolism and thrombosis of left popliteal vein: Secondary | ICD-10-CM

## 2021-06-22 DIAGNOSIS — D509 Iron deficiency anemia, unspecified: Secondary | ICD-10-CM

## 2021-06-22 DIAGNOSIS — E538 Deficiency of other specified B group vitamins: Secondary | ICD-10-CM | POA: Diagnosis not present

## 2021-06-22 MED ORDER — APIXABAN 5 MG PO TABS
5.0000 mg | ORAL_TABLET | Freq: Two times a day (BID) | ORAL | 1 refills | Status: DC
  Filled 2021-06-22: qty 180, 90d supply, fill #0
  Filled 2021-09-27 – 2021-09-30 (×2): qty 180, 90d supply, fill #1
  Filled 2021-10-01: qty 60, 30d supply, fill #1
  Filled 2021-10-27: qty 60, 30d supply, fill #2

## 2021-06-22 MED ORDER — CYANOCOBALAMIN 2000 MCG PO TABS: 2000.0000 ug | ORAL_TABLET | Freq: Every day | ORAL | Status: AC

## 2021-06-22 NOTE — Assessment & Plan Note (Signed)
05/28/2021: Acute DVT left popliteal vein, age-indeterminate DVT left posterior tibial vein and left peroneal vein  Current treatment: Eliquis Labs:  06/26/21: Lupus anticoagulant/antiphospholipid antibody testing: No Lupus anticoagulant detected B12: 181 Ferritin: 25, Iron Sat: 18%, Hb 12.8

## 2021-06-23 ENCOUNTER — Telehealth: Payer: Self-pay | Admitting: Hematology and Oncology

## 2021-06-23 ENCOUNTER — Other Ambulatory Visit (HOSPITAL_COMMUNITY): Payer: Self-pay

## 2021-06-23 NOTE — Telephone Encounter (Signed)
Scheduled per 7/11 los. Called pt and left a msg

## 2021-06-24 ENCOUNTER — Other Ambulatory Visit (HOSPITAL_COMMUNITY): Payer: Self-pay

## 2021-06-25 DIAGNOSIS — Z1231 Encounter for screening mammogram for malignant neoplasm of breast: Secondary | ICD-10-CM | POA: Diagnosis not present

## 2021-08-03 ENCOUNTER — Other Ambulatory Visit (HOSPITAL_COMMUNITY): Payer: Self-pay

## 2021-08-18 ENCOUNTER — Other Ambulatory Visit (HOSPITAL_COMMUNITY): Payer: Self-pay

## 2021-08-18 MED ORDER — SYNTHROID 150 MCG PO TABS
ORAL_TABLET | ORAL | 4 refills | Status: DC
  Filled 2021-08-18: qty 90, 90d supply, fill #0
  Filled 2021-11-09: qty 90, 90d supply, fill #1
  Filled 2022-02-10: qty 90, 90d supply, fill #2
  Filled 2022-05-04: qty 90, 90d supply, fill #3
  Filled 2022-08-09: qty 90, 90d supply, fill #4

## 2021-09-08 ENCOUNTER — Other Ambulatory Visit (HOSPITAL_COMMUNITY): Payer: Self-pay

## 2021-09-10 ENCOUNTER — Ambulatory Visit: Payer: Self-pay | Admitting: Nurse Practitioner

## 2021-09-28 ENCOUNTER — Other Ambulatory Visit (HOSPITAL_COMMUNITY): Payer: Self-pay

## 2021-09-29 ENCOUNTER — Other Ambulatory Visit (HOSPITAL_COMMUNITY): Payer: Self-pay

## 2021-09-30 ENCOUNTER — Other Ambulatory Visit (HOSPITAL_COMMUNITY): Payer: Self-pay

## 2021-10-01 ENCOUNTER — Other Ambulatory Visit (HOSPITAL_COMMUNITY): Payer: Self-pay

## 2021-10-02 ENCOUNTER — Other Ambulatory Visit (HOSPITAL_COMMUNITY): Payer: Self-pay

## 2021-10-06 ENCOUNTER — Other Ambulatory Visit (HOSPITAL_COMMUNITY): Payer: Self-pay

## 2021-10-07 ENCOUNTER — Ambulatory Visit: Payer: 59 | Admitting: Nurse Practitioner

## 2021-10-09 ENCOUNTER — Other Ambulatory Visit (HOSPITAL_COMMUNITY): Payer: Self-pay

## 2021-10-28 ENCOUNTER — Other Ambulatory Visit (HOSPITAL_COMMUNITY): Payer: Self-pay

## 2021-10-31 ENCOUNTER — Other Ambulatory Visit (HOSPITAL_COMMUNITY): Payer: Self-pay

## 2021-11-09 ENCOUNTER — Other Ambulatory Visit (HOSPITAL_COMMUNITY): Payer: Self-pay

## 2021-11-15 ENCOUNTER — Encounter: Payer: Self-pay | Admitting: Hematology and Oncology

## 2021-11-16 ENCOUNTER — Encounter: Payer: Self-pay | Admitting: Nurse Practitioner

## 2021-11-18 ENCOUNTER — Ambulatory Visit: Payer: BC Managed Care – PPO | Admitting: Nurse Practitioner

## 2021-11-18 ENCOUNTER — Encounter: Payer: Self-pay | Admitting: Nurse Practitioner

## 2021-11-18 ENCOUNTER — Other Ambulatory Visit: Payer: Self-pay

## 2021-11-18 VITALS — BP 118/82 | HR 71 | Temp 97.2°F | Ht 64.0 in | Wt 285.0 lb

## 2021-11-18 DIAGNOSIS — Z0001 Encounter for general adult medical examination with abnormal findings: Secondary | ICD-10-CM

## 2021-11-18 DIAGNOSIS — Z7901 Long term (current) use of anticoagulants: Secondary | ICD-10-CM

## 2021-11-18 DIAGNOSIS — Z85850 Personal history of malignant neoplasm of thyroid: Secondary | ICD-10-CM

## 2021-11-18 DIAGNOSIS — Z6841 Body Mass Index (BMI) 40.0 and over, adult: Secondary | ICD-10-CM

## 2021-11-18 DIAGNOSIS — Z7689 Persons encountering health services in other specified circumstances: Secondary | ICD-10-CM | POA: Diagnosis not present

## 2021-11-18 DIAGNOSIS — D509 Iron deficiency anemia, unspecified: Secondary | ICD-10-CM | POA: Diagnosis not present

## 2021-11-18 DIAGNOSIS — E538 Deficiency of other specified B group vitamins: Secondary | ICD-10-CM | POA: Diagnosis not present

## 2021-11-18 DIAGNOSIS — E890 Postprocedural hypothyroidism: Secondary | ICD-10-CM

## 2021-11-18 NOTE — Patient Instructions (Signed)
We will call you with lab results Follow-up in 1-year    Preventive Care 42-42 Years Old, Female Preventive care refers to lifestyle choices and visits with your health care provider that can promote health and wellness. Preventive care visits are also called wellness exams. What can I expect for my preventive care visit? Counseling Your health care provider may ask you questions about your: Medical history, including: Past medical problems. Family medical history. Pregnancy history. Current health, including: Menstrual cycle. Method of birth control. Emotional well-being. Home life and relationship well-being. Sexual activity and sexual health. Lifestyle, including: Alcohol, nicotine or tobacco, and drug use. Access to firearms. Diet, exercise, and sleep habits. Work and work Statistician. Sunscreen use. Safety issues such as seatbelt and bike helmet use. Physical exam Your health care provider will check your: Height and weight. These may be used to calculate your BMI (body mass index). BMI is a measurement that tells if you are at a healthy weight. Waist circumference. This measures the distance around your waistline. This measurement also tells if you are at a healthy weight and may help predict your risk of certain diseases, such as type 2 diabetes and high blood pressure. Heart rate and blood pressure. Body temperature. Skin for abnormal spots. What immunizations do I need? Vaccines are usually given at various ages, according to a schedule. Your health care provider will recommend vaccines for you based on your age, medical history, and lifestyle or other factors, such as travel or where you work. What tests do I need? Screening Your health care provider may recommend screening tests for certain conditions. This may include: Lipid and cholesterol levels. Diabetes screening. This is done by checking your blood sugar (glucose) after you have not eaten for a while  (fasting). Pelvic exam and Pap test. Hepatitis B test. Hepatitis C test. HIV (human immunodeficiency virus) test. STI (sexually transmitted infection) testing, if you are at risk. Lung cancer screening. Colorectal cancer screening. Mammogram. Talk with your health care provider about when you should start having regular mammograms. This may depend on whether you have a family history of breast cancer. BRCA-related cancer screening. This may be done if you have a family history of breast, ovarian, tubal, or peritoneal cancers. Bone density scan. This is done to screen for osteoporosis. Talk with your health care provider about your test results, treatment options, and if necessary, the need for more tests. Follow these instructions at home: Eating and drinking  Eat a diet that includes fresh fruits and vegetables, whole grains, lean protein, and low-fat dairy products. Take vitamin and mineral supplements as recommended by your health care provider. Do not drink alcohol if: Your health care provider tells you not to drink. You are pregnant, may be pregnant, or are planning to become pregnant. If you drink alcohol: Limit how much you have to 0-1 drink a day. Know how much alcohol is in your drink. In the [place] one drink equals one 12 oz bottle of beer (355 mL), one 5 oz glass of wine (148 mL), or one 1 oz glass of hard liquor (44 mL). Lifestyle Brush your teeth every morning and night with fluoride toothpaste. Floss one time each day. Exercise for at least 30 minutes 5 or more days each week. Do not use any products that contain nicotine or tobacco. These products include cigarettes, chewing tobacco, and vaping devices, such as e-cigarettes. If you need help quitting, ask your health care provider. Do not use drugs. If you are sexually active,  practice safe sex. Use a condom or other form of protection to prevent STIs. If you do not wish to become pregnant, use a form of birth control. If  you plan to become pregnant, see your health care provider for a prepregnancy visit. Take aspirin only as told by your health care provider. Make sure that you understand how much to take and what form to take. Work with your health care provider to find out whether it is safe and beneficial for you to take aspirin daily. Find healthy ways to manage stress, such as: Meditation, yoga, or listening to music. Journaling. Talking to a trusted person. Spending time with friends and family. Minimize exposure to UV radiation to reduce your risk of skin cancer. Safety Always wear your seat belt while driving or riding in a vehicle. Do not drive: If you have been drinking alcohol. Do not ride with someone who has been drinking. When you are tired or distracted. While texting. If you have been using any mind-altering substances or drugs. Wear a helmet and other protective equipment during sports activities. If you have firearms in your house, make sure you follow all gun safety procedures. Seek help if you have been physically or sexually abused. What's next? Visit your health care provider once a year for an annual wellness visit. Ask your health care provider how often you should have your eyes and teeth checked. Stay up to date on all vaccines. This information is not intended to replace advice given to you by your health care provider. Make sure you discuss any questions you have with your health care provider. Document Revised: 05/27/2021 Document Reviewed: 05/27/2021 Elsevier Patient Education  [place] Maintenance, Female Adopting a healthy lifestyle and getting preventive care are important in promoting health and wellness. Ask your health care provider about: The right schedule for you to have regular tests and exams. Things you can do on your own to prevent diseases and keep yourself healthy. What should I know about diet, weight, and exercise? Eat a healthy  diet  Eat a diet that includes plenty of vegetables, fruits, low-fat dairy products, and lean protein. Do not eat a lot of foods that are high in solid fats, added sugars, or sodium. Maintain a healthy weight Body mass index (BMI) is used to identify weight problems. It estimates body fat based on height and weight. Your health care provider can help determine your BMI and help you achieve or maintain a healthy weight. Get regular exercise Get regular exercise. This is one of the most important things you can do for your health. Most adults should: Exercise for at least 150 minutes each week. The exercise should increase your heart rate and make you sweat (moderate-intensity exercise). Do strengthening exercises at least twice a week. This is in addition to the moderate-intensity exercise. Spend less time sitting. Even light physical activity can be beneficial. Watch cholesterol and blood lipids Have your blood tested for lipids and cholesterol at 43 years of age, then have this test every 5 years. Have your cholesterol levels checked more often if: Your lipid or cholesterol levels are high. You are older than 42 years of age. You are at high risk for heart disease. What should I know about cancer screening? Depending on your health history and family history, you may need to have cancer screening at various ages. This may include screening for: Breast cancer. Cervical cancer. Colorectal cancer. Skin cancer. Lung cancer. What should I know  about heart disease, diabetes, and high blood pressure? Blood pressure and heart disease High blood pressure causes heart disease and increases the risk of stroke. This is more likely to develop in people who have high blood pressure readings or are overweight. Have your blood pressure checked: Every 3-5 years if you are 59-16 years of age. Every year if you are 70 years old or older. Diabetes Have regular diabetes screenings. This checks your  fasting blood sugar level. Have the screening done: Once every three years after age 62 if you are at a normal weight and have a low risk for diabetes. More often and at a younger age if you are overweight or have a high risk for diabetes. What should I know about preventing infection? Hepatitis B If you have a higher risk for hepatitis B, you should be screened for this virus. Talk with your health care provider to find out if you are at risk for hepatitis B infection. Hepatitis C Testing is recommended for: Everyone born from [birth year] through 1967. Anyone with known risk factors for hepatitis C. Sexually transmitted infections (STIs) Get screened for STIs, including gonorrhea and chlamydia, if: You are sexually active and are younger than 42 years of age. You are older than 42 years of age and your health care provider tells you that you are at risk for this type of infection. Your sexual activity has changed since you were last screened, and you are at increased risk for chlamydia or gonorrhea. Ask your health care provider if you are at risk. Ask your health care provider about whether you are at high risk for HIV. Your health care provider may recommend a prescription medicine to help prevent HIV infection. If you choose to take medicine to prevent HIV, you should first get tested for HIV. You should then be tested every 3 months for as long as you are taking the medicine. Pregnancy If you are about to stop having your period (premenopausal) and you may become pregnant, seek counseling before you get pregnant. Take 400 to 800 micrograms (mcg) of folic acid every day if you become pregnant. Ask for birth control (contraception) if you want to prevent pregnancy. Osteoporosis and menopause Osteoporosis is a disease in which the bones lose minerals and strength with aging. This can result in bone fractures. If you are 77 years old or older, or if you are at risk for osteoporosis and fractures, ask  your health care provider if you should: Be screened for bone loss. Take a calcium or vitamin D supplement to lower your risk of fractures. Be given hormone replacement therapy (HRT) to treat symptoms of menopause. Follow these instructions at home: Alcohol use Do not drink alcohol if: Your health care provider tells you not to drink. You are pregnant, may be pregnant, or are planning to become pregnant. If you drink alcohol: Limit how much you have to: 0-1 drink a day. Know how much alcohol is in your drink. In the [place] one drink equals one 12 oz bottle of beer (355 mL), one 5 oz glass of wine (148 mL), or one 1 oz glass of hard liquor (44 mL). Lifestyle Do not use any products that contain nicotine or tobacco. These products include cigarettes, chewing tobacco, and vaping devices, such as e-cigarettes. If you need help quitting, ask your health care provider. Do not use street drugs. Do not share needles. Ask your health care provider for help if you need support or information about quitting drugs. General  instructions Schedule regular health, dental, and eye exams. Stay current with your vaccines. Tell your health care provider if: You often feel depressed. You have ever been abused or do not feel safe at home. Summary Adopting a healthy lifestyle and getting preventive care are important in promoting health and wellness. Follow your health care provider's instructions about healthy diet, exercising, and getting tested or screened for diseases. Follow your health care provider's instructions on monitoring your cholesterol and blood pressure. This information is not intended to replace advice given to you by your health care provider. Make sure you discuss any questions you have with your health care provider. Document Revised: 04/20/2021 Document Reviewed: 04/20/2021 Elsevier Patient Education  [place].

## 2021-11-18 NOTE — Progress Notes (Addendum)
New Patient Office Visit  Subjective:  Patient ID: [name], female    DOB: 10-31-1981  Age: 40 y.o. MRN: [id number]  CC:  Chief Complaint  Patient presents with   Establish Care     HPI [name] is a 40 year old Caucasian female that presents to establish care. This is her initial visit to the office. She is a Music therapist at [employer]. Married, has three children. She has a past history of thyroid cancer in 2015. She has undergone complete thyroidectomy. Current treatment is Synthroid 150 mcg daily, intolerant to generic thyroid medication. She is followed by Dr Chalmers Cater, endocrinologist yearly. Last TSH 1.430 on 03/10/21. She denies current hypothyroidism symptoms.  [name] has a past history of left lower extremity DVT x 2. The first was 22 years ago, the most recent DVT was Spring of 2022. Current treatment Eliquis 5 mg BID. She is currently under the care of hematologist Dr Lindi Adie, hematologist.  [name] has iron and B12 deficiency anemia. She underwent gastric bypass surgery in 2005 which is suspected to have caused anemias.  Past Medical History:  Diagnosis Date   Abnormal Pap smear of cervix    Anemia    DVT of lower extremity (deep venous thrombosis) ([place]) 1998   due to compression from large ovarian cyst-HAS VENA CAVA FILTER IN PLACE   HPV (human papilloma virus) infection    Iron deficiency    CONSULT BY Dr. Beryle Beams - "PRESUMED [place]"   Left thyroid nodule    Obesity    S/P  Gastric Bypass   Ovarian cyst, left    S/P excision large cyst   Patient is a currently breast-feeding mother    DELIVERED BABY 01-01-14   Polycystic ovarian syndrome     Past Surgical History:  Procedure Laterality Date   CHOLECYSTECTOMY N/A 10/01/2019   Procedure: LAPAROSCOPIC CHOLECYSTECTOMY WITH INTRAOPERATIVE CHOLANGIOGRAM;  Surgeon: Johnathan Hausen, MD;  Location: [facility];  Service: General;   Laterality: N/A;   gastric by-pass     HYSTEROSCOPY     LEFT OOPHORECTOMY  11/98   ROUX-EN-Y PROCEDURE  10/2004   THYROID LOBECTOMY Left 02/14/2014   Procedure: LEFT THYROID LOBECTOMY;  Surgeon: [name], MD;  Location: [facility];  Service: General;  Laterality: Left;   THYROIDECTOMY N/A 02/21/2014   Procedure: COMPLETION THYROIDECTOMY;  Surgeon: [name], MD;  Location: [facility];  Service: General;  Laterality: N/A;   VENA CAVA FILTER PLACEMENT  11/98   [place] filter    Family History  Problem Relation Age of Onset   Hypertension Mother    Depression Mother    Osteoporosis Mother    Thyroid disease Mother        hypo   Hypertension Father    COPD Father    Thyroid disease Father        hypo   Sleep apnea Father    Hypertension Maternal Grandmother    Heart disease Maternal Grandmother    Diabetes Maternal Grandmother    Hypertension Maternal Grandfather    Heart disease Maternal Grandfather    Diabetes Maternal Grandfather    Other Daughter        neurofibromatosis    Social History   Socioeconomic History   Marital status: Married    Spouse name: Not on file   Number of children: Not on file   Years of education: Not on file   Highest education level: Not on file  Occupational History   Not on file  Tobacco Use   Smoking status: Never   Smokeless tobacco: Never  Substance and Sexual Activity   Alcohol use: Yes    Comment: occas   Drug use: No   Sexual activity: Yes  Other Topics Concern   Not on file  Social History Narrative   Not on file   Social Determinants of Health   Financial Resource Strain: Not on file  Food Insecurity: Not on file  Transportation Needs: Not on file  Physical Activity: Not on file  Stress: Not on file  Social Connections: Not on file  Intimate Partner Violence: Not on file    ROS Review of Systems  Constitutional:  Negative for chills, fatigue and fever.  HENT:  Negative for congestion, ear pain, rhinorrhea and  sore throat.   Eyes: Negative.   Respiratory:  Negative for cough and shortness of breath.   Cardiovascular:  Negative for chest pain.  Gastrointestinal:  Negative for abdominal pain, constipation, diarrhea, nausea and vomiting.  Endocrine: Negative.   Genitourinary:  Negative for dysuria and urgency.  Musculoskeletal:  Negative for back pain and myalgias.  Skin: Negative.   Allergic/Immunologic: Negative.   Neurological:  Negative for dizziness, weakness, light-headedness and headaches.  Hematological:  Bruises/bleeds easily.  Psychiatric/Behavioral:  Negative for dysphoric mood. The patient is not nervous/anxious.    Objective:   Today's Vitals: BP 118/82   Pulse 71   Temp (!) 97.2 F (36.2 C)   Ht 5\' 4"  (1.626 m)   Wt 285 lb (129.3 kg)   SpO2 94%   BMI 48.92 kg/m   Physical Exam Vitals reviewed.  Constitutional:      Appearance: She is obese.  HENT:     Head: Normocephalic.     Right Ear: Tympanic membrane is scarred.     Left Ear: Tympanic membrane normal.     Nose: Nose normal.     Mouth/Throat:     Mouth: Mucous membranes are moist.  Eyes:     Pupils: Pupils are equal, round, and reactive to light.  Cardiovascular:     Rate and Rhythm: Normal rate and regular rhythm.     Pulses: Normal pulses.     Heart sounds: Normal heart sounds.  Pulmonary:     Effort: Pulmonary effort is normal.     Breath sounds: Normal breath sounds.  Abdominal:     General: Bowel sounds are normal.     Palpations: Abdomen is soft.  Musculoskeletal:        General: Normal range of motion.     Cervical back: Neck supple.  Skin:    General: Skin is warm and dry.     Capillary Refill: Capillary refill takes less than 2 seconds.  Neurological:     General: No focal deficit present.     Mental Status: She is alert and oriented to person, place, and time.  Psychiatric:        Mood and Affect: Mood normal.        Behavior: Behavior normal.    Assessment & Plan:   1. Encounter to  establish care with new doctor - CBC with Differential/Platelet - Comprehensive metabolic panel - Lipid panel - VITAMIN D 25 Hydroxy (Vit-D Deficiency, Fractures)  2. BMI 45.0-49.9, adult (HCC) - CBC with Differential/Platelet - Comprehensive metabolic panel - Lipid panel - VITAMIN D 25 Hydroxy (Vit-D Deficiency, Fractures)  3. Iron deficiency anemia, unspecified iron deficiency anemia type - CBC with Differential/Platelet -  Iron, TIBC and Ferritin Panel  4. B12 deficiency - CBC with Differential/Platelet - Vitamin B12  5. Long term (current) use of anticoagulants - CBC with Differential/Platelet  6. Postoperative hypothyroidism -continue Levothyroxine 150 mcg daily -continue follow-up with Dr Chalmers Cater as scheduled  7. Personal history of malignant neoplasm of thyroid - CBC with Differential/Platelet - Comprehensive metabolic panel - VITAMIN D 25 Hydroxy (Vit-D Deficiency, Fractures)     We will call you with lab results Follow-up in 1-year   Follow-up:  64-year-old  I, Rip Harbour, NP, have reviewed all documentation for this visit. The documentation on 11/18/21 for the exam, diagnosis, procedures, and orders are all accurate and complete.   Signed,  Jerrell Belfast, DNP

## 2021-11-19 LAB — LIPID PANEL
Chol/HDL Ratio: 2.4 ratio (ref 0.0–4.4)
Cholesterol, Total: 167 mg/dL (ref 100–199)
HDL: 71 mg/dL
LDL Chol Calc (NIH): 85 mg/dL (ref 0–99)
Triglycerides: 57 mg/dL (ref 0–149)
VLDL Cholesterol Cal: 11 mg/dL (ref 5–40)

## 2021-11-19 LAB — CBC WITH DIFFERENTIAL/PLATELET
Basophils Absolute: 0 10*3/uL (ref 0.0–0.2)
Basos: 1 %
EOS (ABSOLUTE): 0.1 10*3/uL (ref 0.0–0.4)
Eos: 1 %
Hematocrit: 40.4 % (ref 34.0–46.6)
Hemoglobin: 13.1 g/dL (ref 11.1–15.9)
Immature Grans (Abs): 0 10*3/uL (ref 0.0–0.1)
Immature Granulocytes: 0 %
Lymphocytes Absolute: 2.7 10*3/uL (ref 0.7–3.1)
Lymphs: 33 %
MCH: 28.6 pg (ref 26.6–33.0)
MCHC: 32.4 g/dL (ref 31.5–35.7)
MCV: 88 fL (ref 79–97)
Monocytes Absolute: 0.6 10*3/uL (ref 0.1–0.9)
Monocytes: 8 %
Neutrophils Absolute: 4.8 10*3/uL (ref 1.4–7.0)
Neutrophils: 57 %
Platelets: 371 10*3/uL (ref 150–450)
RBC: 4.58 x10E6/uL (ref 3.77–5.28)
RDW: 12.4 % (ref 11.7–15.4)
WBC: 8.2 10*3/uL (ref 3.4–10.8)

## 2021-11-19 LAB — COMPREHENSIVE METABOLIC PANEL
ALT: 14 IU/L (ref 0–32)
AST: 20 IU/L (ref 0–40)
Albumin/Globulin Ratio: 2 (ref 1.2–2.2)
Albumin: 4.5 g/dL (ref 3.8–4.8)
Alkaline Phosphatase: 69 IU/L (ref 44–121)
BUN/Creatinine Ratio: 20 (ref 9–23)
BUN: 14 mg/dL (ref 6–24)
Bilirubin Total: 0.4 mg/dL (ref 0.0–1.2)
CO2: 23 mmol/L (ref 20–29)
Calcium: 9 mg/dL (ref 8.7–10.2)
Chloride: 105 mmol/L (ref 96–106)
Creatinine, Ser: 0.71 mg/dL (ref 0.57–1.00)
Globulin, Total: 2.2 g/dL (ref 1.5–4.5)
Glucose: 90 mg/dL (ref 70–99)
Potassium: 4.5 mmol/L (ref 3.5–5.2)
Sodium: 145 mmol/L — ABNORMAL HIGH (ref 134–144)
Total Protein: 6.7 g/dL (ref 6.0–8.5)
eGFR: 109 mL/min/{1.73_m2} (ref >59)

## 2021-11-19 LAB — IRON,TIBC AND FERRITIN PANEL
Ferritin: 16 ng/mL (ref 15–150)
Iron Saturation: 13 % — ABNORMAL LOW (ref 15–55)
Iron: 52 ug/dL (ref 27–159)
Total Iron Binding Capacity: 415 ug/dL (ref 250–450)
UIBC: 363 ug/dL (ref 131–425)

## 2021-11-19 LAB — CARDIOVASCULAR RISK ASSESSMENT

## 2021-11-19 LAB — VITAMIN B12: Vitamin B-12: 1895 pg/mL — ABNORMAL HIGH (ref 232–1245)

## 2021-11-19 LAB — VITAMIN D 25 HYDROXY (VIT D DEFICIENCY, FRACTURES): Vit D, 25-Hydroxy: 34.7 ng/mL (ref 30.0–100.0)

## 2021-11-20 ENCOUNTER — Encounter: Payer: Self-pay | Admitting: Hematology and Oncology

## 2021-11-23 ENCOUNTER — Inpatient Hospital Stay: Payer: BC Managed Care – PPO

## 2021-11-23 NOTE — Assessment & Plan Note (Signed)
05/28/2021: Acute DVT left popliteal vein, age-indeterminate DVT left posterior tibial vein and left peroneal vein  Current treatment: Eliquis (I sent a new prescription for 5 mg p.o. twice daily of Eliquis for the next 5 months) Labs:  06/26/21: Lupus anticoagulant/antiphospholipid antibody testing: No Lupus anticoagulant detected B12: 181 Ferritin: 25, Iron Sat: 18%, Hb 12.8  B12 deficiency: Encouraged her to take 2000 mcg of B12 sublingually daily. We will recheck B12 levels when she comes back in 5 months.  Labs in 6 months and follow-up the day after with a telephone visit.

## 2021-11-23 NOTE — Progress Notes (Signed)
  HEMATOLOGY-ONCOLOGY TELEPHONE VISIT PROGRESS NOTE  I connected with [name] on 11/24/2021 at  3:30 PM EST by telephone and verified that I am speaking with the correct person using two identifiers.  I discussed the limitations, risks, security and privacy concerns of performing an evaluation and management service by telephone and the availability of in person appointments.  I also discussed with the patient that there may be a patient responsible charge related to this service. The patient expressed understanding and agreed to proceed.   History of Present Illness: [name] is a 40 y.o. female with above-mentioned history of acute embolism and thrombosis of unspecified deep veins of left lower extremity. Labs on 06/16/21 showed RBC 4.58, HGB 13.1, HCT 40.4,  PLT 371, Vit B12 1,895, Iron sat 13%, and Ferritin 16. She reports via telephone today for follow-up.   Observations/Objective:     Assessment Plan:  Acute deep vein thrombosis (DVT) of popliteal vein of left lower extremity ([place]) 05/28/2021: Acute DVT left popliteal vein, age-indeterminate DVT left posterior tibial vein and left peroneal vein   Current treatment: Eliquis (I sent a new prescription for 5 mg p.o. twice daily of Eliquis for the next 5 months) Labs:  06/26/21: Lupus anticoagulant/antiphospholipid antibody testing: No Lupus anticoagulant detected B12: 181 Ferritin: 25, Iron Sat: 18%, Hb 12.8 11/18/2021: B12 1895, iron saturation 13%, ferritin 16, hemoglobin 13.1, creatinine 0.71, vitamin D34.7  Iron deficiency anemia: IV iron April 2022 (possible etiology heavy menstrual cycle plus possible malabsorption from Roux-en-Y surgery) I plan to recheck her iron studies and CBC in 6 months and a telephone visit after that   B12 deficiency: Currently on 2000 mcg daily's.  I encouraged her to decrease it to 1000 mcg daily.    I discussed the assessment and treatment plan with the patient. The patient was provided an  opportunity to ask questions and all were answered. The patient agreed with the plan and demonstrated an understanding of the instructions. The patient was advised to call back or seek an in-person evaluation if the symptoms worsen or if the condition fails to improve as anticipated.   Total time spent: 12 mins including non-face to face time and time spent for planning, charting and coordination of care  Sandra Eisenmenger, MD 11/24/2021    I, Thana Ates, am acting as scribe for Nicholas Lose, MD.  I have reviewed the above documentation for accuracy and completeness, and I agree with the above.

## 2021-11-24 ENCOUNTER — Inpatient Hospital Stay: Payer: BC Managed Care – PPO | Attending: Hematology and Oncology | Admitting: Hematology and Oncology

## 2021-11-24 DIAGNOSIS — I82432 Acute embolism and thrombosis of left popliteal vein: Secondary | ICD-10-CM

## 2021-11-24 DIAGNOSIS — Z7901 Long term (current) use of anticoagulants: Secondary | ICD-10-CM | POA: Diagnosis not present

## 2021-11-24 DIAGNOSIS — E538 Deficiency of other specified B group vitamins: Secondary | ICD-10-CM

## 2021-11-24 DIAGNOSIS — D509 Iron deficiency anemia, unspecified: Secondary | ICD-10-CM | POA: Diagnosis not present

## 2021-11-25 ENCOUNTER — Telehealth: Payer: Self-pay | Admitting: Hematology and Oncology

## 2021-11-25 NOTE — Telephone Encounter (Signed)
Scheduled appointment per 12/13 los. Patient is aware. 

## 2021-11-26 ENCOUNTER — Encounter: Payer: Self-pay | Admitting: Hematology and Oncology

## 2021-11-27 ENCOUNTER — Telehealth: Payer: Self-pay | Admitting: *Deleted

## 2021-11-27 NOTE — Telephone Encounter (Signed)
Connected with [name] [phone]) regarding portal message for disability application.  "The insurance company faxed request to office." Currently no form received per tracking form.  Provided main office fax number [phone].  Awaiting form.  Patient currently going out to [employer] but will request company send.

## 2021-12-08 ENCOUNTER — Other Ambulatory Visit (HOSPITAL_COMMUNITY): Payer: Self-pay

## 2021-12-23 ENCOUNTER — Other Ambulatory Visit (HOSPITAL_COMMUNITY): Payer: Self-pay

## 2021-12-28 NOTE — Addendum Note (Signed)
Addended by: Rip Harbour on: 12/28/2021 02:42 PM   Modules accepted: Level of Service

## 2022-01-26 ENCOUNTER — Other Ambulatory Visit (HOSPITAL_COMMUNITY): Payer: Self-pay

## 2022-02-10 ENCOUNTER — Other Ambulatory Visit (HOSPITAL_COMMUNITY): Payer: Self-pay

## 2022-02-11 ENCOUNTER — Encounter: Payer: BC Managed Care – PPO | Admitting: Physician Assistant

## 2022-02-11 ENCOUNTER — Other Ambulatory Visit (HOSPITAL_COMMUNITY): Payer: Self-pay

## 2022-02-11 NOTE — Progress Notes (Signed)
Erroneous encounter. Meant for daughter ?

## 2022-03-03 ENCOUNTER — Other Ambulatory Visit (HOSPITAL_COMMUNITY): Payer: Self-pay

## 2022-03-10 DIAGNOSIS — C73 Malignant neoplasm of thyroid gland: Secondary | ICD-10-CM | POA: Diagnosis not present

## 2022-03-10 DIAGNOSIS — E890 Postprocedural hypothyroidism: Secondary | ICD-10-CM | POA: Diagnosis not present

## 2022-03-31 DIAGNOSIS — D2239 Melanocytic nevi of other parts of face: Secondary | ICD-10-CM | POA: Diagnosis not present

## 2022-03-31 DIAGNOSIS — E890 Postprocedural hypothyroidism: Secondary | ICD-10-CM | POA: Diagnosis not present

## 2022-03-31 DIAGNOSIS — L814 Other melanin hyperpigmentation: Secondary | ICD-10-CM | POA: Diagnosis not present

## 2022-03-31 DIAGNOSIS — D225 Melanocytic nevi of trunk: Secondary | ICD-10-CM | POA: Diagnosis not present

## 2022-03-31 DIAGNOSIS — C73 Malignant neoplasm of thyroid gland: Secondary | ICD-10-CM | POA: Diagnosis not present

## 2022-04-19 ENCOUNTER — Encounter: Payer: Self-pay | Admitting: Nurse Practitioner

## 2022-04-20 ENCOUNTER — Encounter: Payer: Self-pay | Admitting: Nurse Practitioner

## 2022-04-20 ENCOUNTER — Other Ambulatory Visit: Payer: Self-pay | Admitting: Nurse Practitioner

## 2022-04-20 DIAGNOSIS — R110 Nausea: Secondary | ICD-10-CM

## 2022-04-20 MED ORDER — SCOPOLAMINE 1 MG/3DAYS TD PT72
1.0000 | MEDICATED_PATCH | TRANSDERMAL | 12 refills | Status: DC
  Filled 2022-04-22: qty 10, 30d supply, fill #0

## 2022-04-20 MED ORDER — ONDANSETRON 4 MG PO TBDP
4.0000 mg | ORAL_TABLET | Freq: Three times a day (TID) | ORAL | 1 refills | Status: DC | PRN
  Filled 2022-04-22: qty 30, 10d supply, fill #0

## 2022-04-22 ENCOUNTER — Other Ambulatory Visit (HOSPITAL_COMMUNITY): Payer: Self-pay

## 2022-04-23 ENCOUNTER — Other Ambulatory Visit (HOSPITAL_COMMUNITY): Payer: Self-pay

## 2022-05-04 ENCOUNTER — Other Ambulatory Visit (HOSPITAL_COMMUNITY): Payer: Self-pay

## 2022-05-20 NOTE — Progress Notes (Signed)
HEMATOLOGY-ONCOLOGY TELEPHONE VISIT PROGRESS NOTE  I connected with [name] on 06/03/22 at  3:30 PM EDT by telephone and verified that I am speaking with the correct person using two identifiers.  I discussed the limitations, risks, security and privacy concerns of performing an evaluation and management service by telephone and the availability of in person appointments.  I also discussed with the patient that there may be a patient responsible charge related to this service. The patient expressed understanding and agreed to proceed.   History of Present Illness: [name] is a 41 y.o. female with above-mentioned history of acute embolism and thrombosis of unspecified deep veins of left lower extremity. She presents to the clinic via telephone follow-up.  She does not feel too badly although she does have fatigue issues.  She continues to have heavy menstrual cycles.  REVIEW OF SYSTEMS:   Constitutional: Denies fevers, chills or abnormal weight loss All other systems were reviewed with the patient and are negative.  Observations/Objective:    Assessment Plan:  Acute deep vein thrombosis (DVT) of popliteal vein of left lower extremity ([place]) 05/28/2021: Acute DVT left popliteal vein, age-indeterminate DVT left posterior tibial vein and left peroneal vein   Current treatment: Eliquis (I sent a new prescription for 5 mg p.o. twice daily of Eliquis for the next 5 months) Labs:  06/26/21: Lupus anticoagulant/antiphospholipid antibody testing: No Lupus anticoagulant detected B12: 181 Ferritin: 25, Iron Sat: 18%, Hb 12.8 11/18/2021: B12 1895, iron saturation 13%, ferritin 16, hemoglobin 13.1, creatinine 0.71, vitamin D34.7 06/02/22: WBC 10.7, Hb 11.5, MCV 87, Ferritin 5, Iron Sat 8%   Iron deficiency anemia: IV iron April 2022 She wanted to try and see if oral iron supplementation can prevent iron infusion requirements in the future.  I sent a prescription for ferrous gluconate.  (possible  etiology heavy menstrual cycle plus possible malabsorption from Roux-en-Y surgery): She will try to request an IUD placement to stop the heavy menstrual cycle.  B12 deficiency: Currently on 2000 mcg daily's.  I encouraged her to decrease it to 1000 mcg daily.  Recommend 3 doses of Venofer IV Iron 6 months labs and follow-up after that with a telephone visit   I discussed the assessment and treatment plan with the patient. The patient was provided an opportunity to ask questions and all were answered. The patient agreed with the plan and demonstrated an understanding of the instructions. The patient was advised to call back or seek an in-person evaluation if the symptoms worsen or if the condition fails to improve as anticipated.   I provided 12 minutes of non-face-to-face time during this encounter. Harriette Ohara, MD  I Gardiner Coins am scribing for Dr. Lindi Adie  I have reviewed the above documentation for accuracy and completeness, and I agree with the above.

## 2022-06-01 ENCOUNTER — Other Ambulatory Visit (HOSPITAL_COMMUNITY): Payer: Self-pay

## 2022-06-01 MED ORDER — LIOTHYRONINE SODIUM 5 MCG PO TABS
ORAL_TABLET | ORAL | 4 refills | Status: DC
  Filled 2022-06-01: qty 90, 90d supply, fill #0
  Filled 2022-08-09: qty 90, 90d supply, fill #1
  Filled 2022-11-28: qty 90, 90d supply, fill #2
  Filled 2023-02-08: qty 90, 90d supply, fill #3
  Filled 2023-05-11: qty 90, 90d supply, fill #4

## 2022-06-02 ENCOUNTER — Inpatient Hospital Stay: Payer: BC Managed Care – PPO | Attending: Hematology and Oncology

## 2022-06-02 ENCOUNTER — Other Ambulatory Visit: Payer: Self-pay

## 2022-06-02 DIAGNOSIS — Z7901 Long term (current) use of anticoagulants: Secondary | ICD-10-CM | POA: Insufficient documentation

## 2022-06-02 DIAGNOSIS — Z86718 Personal history of other venous thrombosis and embolism: Secondary | ICD-10-CM | POA: Insufficient documentation

## 2022-06-02 DIAGNOSIS — D500 Iron deficiency anemia secondary to blood loss (chronic): Secondary | ICD-10-CM | POA: Insufficient documentation

## 2022-06-02 DIAGNOSIS — E538 Deficiency of other specified B group vitamins: Secondary | ICD-10-CM | POA: Insufficient documentation

## 2022-06-02 DIAGNOSIS — D509 Iron deficiency anemia, unspecified: Secondary | ICD-10-CM

## 2022-06-02 DIAGNOSIS — N920 Excessive and frequent menstruation with regular cycle: Secondary | ICD-10-CM | POA: Insufficient documentation

## 2022-06-02 LAB — CBC WITH DIFFERENTIAL (CANCER CENTER ONLY)
Abs Immature Granulocytes: 0.04 10*3/uL (ref 0.00–0.07)
Basophils Absolute: 0.1 10*3/uL (ref 0.0–0.1)
Basophils Relative: 1 %
Eosinophils Absolute: 0.2 10*3/uL (ref 0.0–0.5)
Eosinophils Relative: 2 %
HCT: 34.7 % — ABNORMAL LOW (ref 36.0–46.0)
Hemoglobin: 11.5 g/dL — ABNORMAL LOW (ref 12.0–15.0)
Immature Granulocytes: 0 %
Lymphocytes Relative: 23 %
Lymphs Abs: 2.5 10*3/uL (ref 0.7–4.0)
MCH: 28.8 pg (ref 26.0–34.0)
MCHC: 33.1 g/dL (ref 30.0–36.0)
MCV: 87 fL (ref 80.0–100.0)
Monocytes Absolute: 0.8 10*3/uL (ref 0.1–1.0)
Monocytes Relative: 7 %
Neutro Abs: 7.2 10*3/uL (ref 1.7–7.7)
Neutrophils Relative %: 67 %
Platelet Count: 350 10*3/uL (ref 150–400)
RBC: 3.99 MIL/uL (ref 3.87–5.11)
RDW: 13.8 % (ref 11.5–15.5)
WBC Count: 10.7 10*3/uL — ABNORMAL HIGH (ref 4.0–10.5)
nRBC: 0 % (ref 0.0–0.2)

## 2022-06-02 LAB — IRON AND IRON BINDING CAPACITY (CC-WL,HP ONLY)
Iron: 39 ug/dL (ref 28–170)
Saturation Ratios: 8 % — ABNORMAL LOW (ref 10.4–31.8)
TIBC: 508 ug/dL — ABNORMAL HIGH (ref 250–450)
UIBC: 469 ug/dL

## 2022-06-03 ENCOUNTER — Other Ambulatory Visit (HOSPITAL_COMMUNITY): Payer: Self-pay

## 2022-06-03 ENCOUNTER — Inpatient Hospital Stay: Payer: BC Managed Care – PPO | Admitting: Hematology and Oncology

## 2022-06-03 DIAGNOSIS — I82432 Acute embolism and thrombosis of left popliteal vein: Secondary | ICD-10-CM

## 2022-06-03 DIAGNOSIS — E538 Deficiency of other specified B group vitamins: Secondary | ICD-10-CM | POA: Diagnosis not present

## 2022-06-03 DIAGNOSIS — Z7901 Long term (current) use of anticoagulants: Secondary | ICD-10-CM

## 2022-06-03 LAB — FERRITIN: Ferritin: 5 ng/mL — ABNORMAL LOW (ref 11–307)

## 2022-06-03 MED ORDER — FERROUS GLUCONATE 324 (38 FE) MG PO TABS
324.0000 mg | ORAL_TABLET | Freq: Every day | ORAL | 3 refills | Status: DC
  Filled 2022-06-03: qty 90, 90d supply, fill #0

## 2022-06-03 NOTE — Assessment & Plan Note (Signed)
05/28/2021: Acute DVT left popliteal vein, age-indeterminate DVT left posterior tibial vein and left peroneal vein  Current treatment: Eliquis(I sent a new prescription for 5 mg p.o. twice daily of Eliquis for the next 5 months) Labs:  06/26/21:Lupus anticoagulant/antiphospholipid antibody testing: No Lupus anticoagulant detected B12: 181 Ferritin: 25, Iron Sat: 18%, Hb 12.8 11/18/2021: B12 1895, iron saturation 13%, ferritin 16, hemoglobin 13.1, creatinine 0.71, vitamin D34.7 06/02/22: WBC 10.7, Hb 11.5, MCV 87, Ferritin 5, Iron Sat 8%  Iron deficiency anemia: IV iron April 2022  (possible etiology heavy menstrual cycle plus possible malabsorption from Roux-en-Y surgery)   B12 deficiency: Currently on 2000 mcg daily's.  I encouraged her to decrease it to 1000 mcg daily.   Recommend 3 doses of Venofer IV Iron

## 2022-06-07 ENCOUNTER — Encounter: Payer: Self-pay | Admitting: Hematology and Oncology

## 2022-06-07 ENCOUNTER — Telehealth: Payer: Self-pay | Admitting: Hematology and Oncology

## 2022-06-10 ENCOUNTER — Encounter: Payer: Self-pay | Admitting: Hematology and Oncology

## 2022-06-11 ENCOUNTER — Encounter: Payer: Self-pay | Admitting: Hematology and Oncology

## 2022-06-11 ENCOUNTER — Inpatient Hospital Stay: Payer: BC Managed Care – PPO

## 2022-06-11 VITALS — BP 123/77 | HR 65 | Temp 98.6°F | Resp 17

## 2022-06-11 DIAGNOSIS — D500 Iron deficiency anemia secondary to blood loss (chronic): Secondary | ICD-10-CM | POA: Diagnosis not present

## 2022-06-11 DIAGNOSIS — Z7901 Long term (current) use of anticoagulants: Secondary | ICD-10-CM | POA: Diagnosis not present

## 2022-06-11 DIAGNOSIS — E538 Deficiency of other specified B group vitamins: Secondary | ICD-10-CM | POA: Diagnosis not present

## 2022-06-11 DIAGNOSIS — N920 Excessive and frequent menstruation with regular cycle: Secondary | ICD-10-CM | POA: Diagnosis not present

## 2022-06-11 DIAGNOSIS — Z86718 Personal history of other venous thrombosis and embolism: Secondary | ICD-10-CM | POA: Diagnosis not present

## 2022-06-11 DIAGNOSIS — E611 Iron deficiency: Secondary | ICD-10-CM

## 2022-06-11 MED ORDER — SODIUM CHLORIDE 0.9 % IV SOLN
300.0000 mg | Freq: Once | INTRAVENOUS | Status: AC
  Administered 2022-06-11: 300 mg via INTRAVENOUS
  Filled 2022-06-11: qty 300

## 2022-06-11 MED ORDER — SODIUM CHLORIDE 0.9 % IV SOLN: Freq: Once | INTRAVENOUS | Status: AC

## 2022-06-23 ENCOUNTER — Other Ambulatory Visit: Payer: Self-pay

## 2022-06-23 ENCOUNTER — Inpatient Hospital Stay: Payer: BC Managed Care – PPO | Attending: Hematology and Oncology

## 2022-06-23 VITALS — BP 126/67 | HR 64 | Temp 98.2°F | Resp 17

## 2022-06-23 DIAGNOSIS — D500 Iron deficiency anemia secondary to blood loss (chronic): Secondary | ICD-10-CM | POA: Insufficient documentation

## 2022-06-23 DIAGNOSIS — E538 Deficiency of other specified B group vitamins: Secondary | ICD-10-CM | POA: Diagnosis not present

## 2022-06-23 DIAGNOSIS — E611 Iron deficiency: Secondary | ICD-10-CM

## 2022-06-23 DIAGNOSIS — N920 Excessive and frequent menstruation with regular cycle: Secondary | ICD-10-CM | POA: Diagnosis not present

## 2022-06-23 DIAGNOSIS — Z7901 Long term (current) use of anticoagulants: Secondary | ICD-10-CM | POA: Diagnosis not present

## 2022-06-23 DIAGNOSIS — Z86718 Personal history of other venous thrombosis and embolism: Secondary | ICD-10-CM | POA: Diagnosis not present

## 2022-06-23 MED ORDER — SODIUM CHLORIDE 0.9 % IV SOLN: Freq: Once | INTRAVENOUS | Status: AC

## 2022-06-23 MED ORDER — SODIUM CHLORIDE 0.9 % IV SOLN
300.0000 mg | Freq: Once | INTRAVENOUS | Status: AC
  Administered 2022-06-23: 300 mg via INTRAVENOUS
  Filled 2022-06-23: qty 300

## 2022-06-28 DIAGNOSIS — R87619 Unspecified abnormal cytological findings in specimens from cervix uteri: Secondary | ICD-10-CM | POA: Diagnosis not present

## 2022-06-28 DIAGNOSIS — Z01419 Encounter for gynecological examination (general) (routine) without abnormal findings: Secondary | ICD-10-CM | POA: Diagnosis not present

## 2022-06-28 DIAGNOSIS — Z30430 Encounter for insertion of intrauterine contraceptive device: Secondary | ICD-10-CM | POA: Diagnosis not present

## 2022-06-28 DIAGNOSIS — Z1389 Encounter for screening for other disorder: Secondary | ICD-10-CM | POA: Diagnosis not present

## 2022-06-28 DIAGNOSIS — Z1231 Encounter for screening mammogram for malignant neoplasm of breast: Secondary | ICD-10-CM | POA: Diagnosis not present

## 2022-07-01 ENCOUNTER — Other Ambulatory Visit: Payer: Self-pay

## 2022-07-01 ENCOUNTER — Inpatient Hospital Stay: Payer: BC Managed Care – PPO

## 2022-07-01 VITALS — BP 112/71 | HR 69 | Temp 98.0°F | Resp 17

## 2022-07-01 DIAGNOSIS — Z86718 Personal history of other venous thrombosis and embolism: Secondary | ICD-10-CM | POA: Diagnosis not present

## 2022-07-01 DIAGNOSIS — E538 Deficiency of other specified B group vitamins: Secondary | ICD-10-CM | POA: Diagnosis not present

## 2022-07-01 DIAGNOSIS — E611 Iron deficiency: Secondary | ICD-10-CM

## 2022-07-01 DIAGNOSIS — D500 Iron deficiency anemia secondary to blood loss (chronic): Secondary | ICD-10-CM | POA: Diagnosis not present

## 2022-07-01 DIAGNOSIS — N920 Excessive and frequent menstruation with regular cycle: Secondary | ICD-10-CM | POA: Diagnosis not present

## 2022-07-01 DIAGNOSIS — Z7901 Long term (current) use of anticoagulants: Secondary | ICD-10-CM | POA: Diagnosis not present

## 2022-07-01 MED ORDER — SODIUM CHLORIDE 0.9 % IV SOLN: Freq: Once | INTRAVENOUS | Status: AC

## 2022-07-01 MED ORDER — SODIUM CHLORIDE 0.9 % IV SOLN
300.0000 mg | Freq: Once | INTRAVENOUS | Status: AC
  Administered 2022-07-01: 300 mg via INTRAVENOUS
  Filled 2022-07-01: qty 300

## 2022-07-01 NOTE — Patient Instructions (Signed)

## 2022-07-01 NOTE — Progress Notes (Signed)
Pt declined to stay for 30 min post obs. Ambulatory to lobby with [facility] upon discharge.

## 2022-08-09 ENCOUNTER — Other Ambulatory Visit (HOSPITAL_COMMUNITY): Payer: Self-pay

## 2022-08-26 ENCOUNTER — Other Ambulatory Visit (HOSPITAL_COMMUNITY): Payer: Self-pay

## 2022-08-27 ENCOUNTER — Other Ambulatory Visit (HOSPITAL_COMMUNITY): Payer: Self-pay

## 2022-08-30 ENCOUNTER — Other Ambulatory Visit (HOSPITAL_COMMUNITY): Payer: Self-pay

## 2022-09-29 ENCOUNTER — Other Ambulatory Visit (HOSPITAL_COMMUNITY): Payer: Self-pay

## 2022-09-29 MED ORDER — SULFAMETHOXAZOLE-TRIMETHOPRIM 800-160 MG PO TABS
1.0000 | ORAL_TABLET | Freq: Two times a day (BID) | ORAL | 0 refills | Status: DC
  Filled 2022-09-29: qty 10, 5d supply, fill #0

## 2022-10-22 DIAGNOSIS — D485 Neoplasm of uncertain behavior of skin: Secondary | ICD-10-CM | POA: Diagnosis not present

## 2022-10-22 DIAGNOSIS — L570 Actinic keratosis: Secondary | ICD-10-CM | POA: Diagnosis not present

## 2022-11-03 ENCOUNTER — Encounter: Payer: Self-pay | Admitting: Hematology and Oncology

## 2022-11-05 ENCOUNTER — Other Ambulatory Visit (HOSPITAL_COMMUNITY): Payer: Self-pay

## 2022-11-06 ENCOUNTER — Other Ambulatory Visit (HOSPITAL_COMMUNITY): Payer: Self-pay

## 2022-11-06 MED ORDER — LEVOTHYROXINE SODIUM 150 MCG PO TABS
150.0000 ug | ORAL_TABLET | Freq: Every morning | ORAL | 4 refills | Status: DC
  Filled 2022-11-06: qty 90, 90d supply, fill #0
  Filled 2023-02-08: qty 90, 90d supply, fill #1
  Filled 2023-05-11: qty 90, 90d supply, fill #2
  Filled 2023-08-02: qty 90, 90d supply, fill #3
  Filled 2023-11-03: qty 90, 90d supply, fill #4

## 2022-11-09 ENCOUNTER — Telehealth: Payer: Self-pay | Admitting: Hematology and Oncology

## 2022-11-09 NOTE — Telephone Encounter (Signed)
Called patient to r/s phone visit. Left voicemail with new appointment information.

## 2022-11-16 ENCOUNTER — Encounter: Payer: BC Managed Care – PPO | Admitting: Nurse Practitioner

## 2022-11-25 ENCOUNTER — Encounter: Payer: Self-pay | Admitting: Hematology and Oncology

## 2022-11-26 ENCOUNTER — Encounter: Payer: Self-pay | Admitting: Hematology and Oncology

## 2022-11-27 ENCOUNTER — Encounter: Payer: Self-pay | Admitting: Hematology and Oncology

## 2022-11-29 ENCOUNTER — Encounter: Payer: BC Managed Care – PPO | Admitting: Nurse Practitioner

## 2022-12-03 ENCOUNTER — Inpatient Hospital Stay: Payer: BC Managed Care – PPO

## 2022-12-07 ENCOUNTER — Inpatient Hospital Stay: Payer: BC Managed Care – PPO | Admitting: Hematology and Oncology

## 2022-12-08 ENCOUNTER — Telehealth: Payer: Self-pay | Admitting: Hematology and Oncology

## 2022-12-08 NOTE — Telephone Encounter (Signed)
Rescheduled appointment per provider on-call. Patient is aware of the changes made to her upcoming appointment. 

## 2022-12-14 ENCOUNTER — Inpatient Hospital Stay: Payer: BC Managed Care – PPO | Admitting: Hematology and Oncology

## 2022-12-14 ENCOUNTER — Inpatient Hospital Stay: Payer: BC Managed Care – PPO | Attending: Hematology and Oncology

## 2022-12-14 DIAGNOSIS — D509 Iron deficiency anemia, unspecified: Secondary | ICD-10-CM | POA: Diagnosis not present

## 2022-12-14 DIAGNOSIS — Z86718 Personal history of other venous thrombosis and embolism: Secondary | ICD-10-CM | POA: Insufficient documentation

## 2022-12-14 DIAGNOSIS — E538 Deficiency of other specified B group vitamins: Secondary | ICD-10-CM | POA: Insufficient documentation

## 2022-12-14 DIAGNOSIS — I82432 Acute embolism and thrombosis of left popliteal vein: Secondary | ICD-10-CM

## 2022-12-14 DIAGNOSIS — Z7901 Long term (current) use of anticoagulants: Secondary | ICD-10-CM | POA: Insufficient documentation

## 2022-12-14 LAB — CBC WITH DIFFERENTIAL (CANCER CENTER ONLY)
Abs Immature Granulocytes: 0.01 10*3/uL (ref 0.00–0.07)
Basophils Absolute: 0 10*3/uL (ref 0.0–0.1)
Basophils Relative: 1 %
Eosinophils Absolute: 0.1 10*3/uL (ref 0.0–0.5)
Eosinophils Relative: 2 %
HCT: 39.7 % (ref 36.0–46.0)
Hemoglobin: 13.2 g/dL (ref 12.0–15.0)
Immature Granulocytes: 0 %
Lymphocytes Relative: 30 %
Lymphs Abs: 2.2 10*3/uL (ref 0.7–4.0)
MCH: 30.8 pg (ref 26.0–34.0)
MCHC: 33.2 g/dL (ref 30.0–36.0)
MCV: 92.5 fL (ref 80.0–100.0)
Monocytes Absolute: 0.6 10*3/uL (ref 0.1–1.0)
Monocytes Relative: 8 %
Neutro Abs: 4.4 10*3/uL (ref 1.7–7.7)
Neutrophils Relative %: 59 %
Platelet Count: 306 10*3/uL (ref 150–400)
RBC: 4.29 MIL/uL (ref 3.87–5.11)
RDW: 13.1 % (ref 11.5–15.5)
WBC Count: 7.4 10*3/uL (ref 4.0–10.5)
nRBC: 0 % (ref 0.0–0.2)

## 2022-12-14 LAB — IRON AND IRON BINDING CAPACITY (CC-WL,HP ONLY)
Iron: 109 ug/dL (ref 28–170)
Saturation Ratios: 32 % — ABNORMAL HIGH (ref 10.4–31.8)
TIBC: 337 ug/dL (ref 250–450)
UIBC: 228 ug/dL (ref 148–442)

## 2022-12-14 LAB — FERRITIN: Ferritin: 33 ng/mL (ref 11–307)

## 2022-12-21 ENCOUNTER — Inpatient Hospital Stay: Payer: BC Managed Care – PPO | Admitting: Hematology and Oncology

## 2022-12-24 ENCOUNTER — Encounter: Payer: Self-pay | Admitting: Hematology and Oncology

## 2022-12-28 ENCOUNTER — Inpatient Hospital Stay (HOSPITAL_BASED_OUTPATIENT_CLINIC_OR_DEPARTMENT_OTHER): Payer: BC Managed Care – PPO | Admitting: Hematology and Oncology

## 2022-12-28 DIAGNOSIS — I82432 Acute embolism and thrombosis of left popliteal vein: Secondary | ICD-10-CM

## 2022-12-28 NOTE — Assessment & Plan Note (Signed)
05/28/2021: Acute DVT left popliteal vein, age-indeterminate DVT left posterior tibial vein and left peroneal vein   Current treatment: Eliquis (I sent a new prescription for 5 mg p.o. twice daily of Eliquis for the next 5 months) Labs:  06/26/21: Lupus anticoagulant/antiphospholipid antibody testing: No Lupus anticoagulant detected B12: 181 Ferritin: 25, Iron Sat: 18%, Hb 12.8 11/18/2021: B12 1895, iron saturation 13%, ferritin 16, hemoglobin 13.1, creatinine 0.71, vitamin D34.7 06/02/22: WBC 10.7, Hb 11.5, MCV 87, Ferritin 5, Iron Sat 8% 12/14/2022: WBC 7.4, hemoglobin 13.2, MCV 92.5, iron saturation 32%, ferritin 33   Iron deficiency anemia: IV iron April 2022, June 2023   (possible etiology heavy menstrual cycle plus possible malabsorption from Roux-en-Y surgery): She will try to request an IUD placement to stop the heavy menstrual cycle.   B12 deficiency: Currently on 2000 mcg daily's.  I encouraged her to decrease it to 1000 mcg daily.  Return to clinic in 6 months for follow-up with labs done ahead of time

## 2022-12-28 NOTE — Progress Notes (Signed)
HEMATOLOGY-ONCOLOGY TELEPHONE VISIT PROGRESS NOTE  I connected with our patient on 12/28/22 at 12:00 PM EST by telephone and verified that I am speaking with the correct person using two identifiers.  I discussed the limitations, risks, security and privacy concerns of performing an evaluation and management service by telephone and the availability of in person appointments.  I also discussed with the patient that there may be a patient responsible charge related to this service. The patient expressed understanding and agreed to proceed.   History of Present Illness: Telephone follow-up to discuss results of iron studies.  She appears to be doing very well without any major problems with fatigue or shortness of breath or lightheadedness or dizziness or any other symptom of anemia.    REVIEW OF SYSTEMS:   Constitutional: Denies fevers, chills or abnormal weight loss All other systems were reviewed with the patient and are negative. Observations/Objective:     Assessment Plan:  Acute deep vein thrombosis (DVT) of popliteal vein of left lower extremity ([place]) 05/28/2021: Acute DVT left popliteal vein, age-indeterminate DVT left posterior tibial vein and left peroneal vein   Current treatment: Eliquis (I sent a new prescription for 5 mg p.o. twice daily of Eliquis for the next 5 months) Labs:  06/26/21: Lupus anticoagulant/antiphospholipid antibody testing: No Lupus anticoagulant detected B12: 181 Ferritin: 25, Iron Sat: 18%, Hb 12.8 11/18/2021: B12 1895, iron saturation 13%, ferritin 16, hemoglobin 13.1, creatinine 0.71, vitamin D34.7 06/02/22: WBC 10.7, Hb 11.5, MCV 87, Ferritin 5, Iron Sat 8% 12/14/2022: WBC 7.4, hemoglobin 13.2, MCV 92.5, iron saturation 32%, ferritin 33   Iron deficiency anemia: IV iron April 2022, June 2023   (possible etiology heavy menstrual cycle plus possible malabsorption from Roux-en-Y surgery): IUD placed.   B12 deficiency: Currently on 2000 mcg daily's.  I  encouraged her to decrease it to 1000 mcg daily. She will try to get her iron levels checked with her primary care physician and keep an eye on it.  If the iron levels were to run low again she will contact us.  I expect that since she has IUD in place and does not have the heavy menstrual cycles her iron levels will remain in good shape going forwards.  Return to clinic on as needed basis   I discussed the assessment and treatment plan with the patient. The patient was provided an opportunity to ask questions and all were answered. The patient agreed with the plan and demonstrated an understanding of the instructions. The patient was advised to call back or seek an in-person evaluation if the symptoms worsen or if the condition fails to improve as anticipated.   I provided 12 minutes of non-face-to-face time during this encounter.  This includes time for charting and coordination of care   Harriette Ohara, MD

## 2023-01-14 ENCOUNTER — Encounter: Payer: Self-pay | Admitting: Hematology and Oncology

## 2023-01-24 ENCOUNTER — Telehealth: Payer: Self-pay

## 2023-01-24 NOTE — Telephone Encounter (Signed)
I left a message on the number(s) listed in the patients chart requesting the patient to call back regarding the [place] appointment for 01/27/2023. The provider is out of the office that day. The appointment has been canceled. Waiting for the patient to return the call.

## 2023-01-24 NOTE — Telephone Encounter (Signed)
Appointment has been rescheduled to 02/09/2023

## 2023-01-25 ENCOUNTER — Encounter: Payer: Self-pay | Admitting: Hematology and Oncology

## 2023-01-27 ENCOUNTER — Encounter: Payer: Self-pay | Admitting: Nurse Practitioner

## 2023-02-08 ENCOUNTER — Encounter: Payer: Self-pay | Admitting: Hematology and Oncology

## 2023-02-09 ENCOUNTER — Encounter: Payer: Self-pay | Admitting: Nurse Practitioner

## 2023-02-09 ENCOUNTER — Ambulatory Visit (INDEPENDENT_AMBULATORY_CARE_PROVIDER_SITE_OTHER): Payer: BC Managed Care – PPO | Admitting: Nurse Practitioner

## 2023-02-09 VITALS — BP 118/70 | HR 83 | Temp 97.1°F | Resp 18 | Ht 64.0 in | Wt 275.8 lb

## 2023-02-09 DIAGNOSIS — Z0000 Encounter for general adult medical examination without abnormal findings: Secondary | ICD-10-CM | POA: Diagnosis not present

## 2023-02-09 DIAGNOSIS — C73 Malignant neoplasm of thyroid gland: Secondary | ICD-10-CM | POA: Diagnosis not present

## 2023-02-09 NOTE — Patient Instructions (Addendum)
Follow up in 1 year Continue Follow up with Endocrinologist  Preventive Care 15-44 Years Old, Female Preventive care refers to lifestyle choices and visits with your health care provider that can promote health and wellness. Preventive care visits are also called wellness exams. What can I expect for my preventive care visit? Counseling Your health care provider may ask you questions about your: Medical history, including: Past medical problems. Family medical history. Pregnancy history. Current health, including: Menstrual cycle. Method of birth control. Emotional well-being. Home life and relationship well-being. Sexual activity and sexual health. Lifestyle, including: Alcohol, nicotine or tobacco, and drug use. Access to firearms. Diet, exercise, and sleep habits. Work and work Statistician. Sunscreen use. Safety issues such as seatbelt and bike helmet use. Physical exam Your health care provider will check your: Height and weight. These may be used to calculate your BMI (body mass index). BMI is a measurement that tells if you are at a healthy weight. Waist circumference. This measures the distance around your waistline. This measurement also tells if you are at a healthy weight and may help predict your risk of certain diseases, such as type 2 diabetes and high blood pressure. Heart rate and blood pressure. Body temperature. Skin for abnormal spots. What immunizations do I need?  Vaccines are usually given at various ages, according to a schedule. Your health care provider will recommend vaccines for you based on your age, medical history, and lifestyle or other factors, such as travel or where you work. What tests do I need? Screening Your health care provider may recommend screening tests for certain conditions. This may include: Lipid and cholesterol levels. Diabetes screening. This is done by checking your blood sugar (glucose) after you have not eaten for a while  (fasting). Pelvic exam and Pap test. Hepatitis B test. Hepatitis C test. HIV (human immunodeficiency virus) test. STI (sexually transmitted infection) testing, if you are at risk. Lung cancer screening. Colorectal cancer screening. Mammogram. Talk with your health care provider about when you should start having regular mammograms. This may depend on whether you have a family history of breast cancer. BRCA-related cancer screening. This may be done if you have a family history of breast, ovarian, tubal, or peritoneal cancers. Bone density scan. This is done to screen for osteoporosis. Talk with your health care provider about your test results, treatment options, and if necessary, the need for more tests. Follow these instructions at home: Eating and drinking  Eat a diet that includes fresh fruits and vegetables, whole grains, lean protein, and low-fat dairy products. Take vitamin and mineral supplements as recommended by your health care provider. Do not drink alcohol if: Your health care provider tells you not to drink. You are pregnant, may be pregnant, or are planning to become pregnant. If you drink alcohol: Limit how much you have to 0-1 drink a day. Know how much alcohol is in your drink. In the [place] one drink equals one 12 oz bottle of beer (355 mL), one 5 oz glass of wine (148 mL), or one 1 oz glass of hard liquor (44 mL). Lifestyle Brush your teeth every morning and night with fluoride toothpaste. Floss one time each day. Exercise for at least 30 minutes 5 or more days each week. Do not use any products that contain nicotine or tobacco. These products include cigarettes, chewing tobacco, and vaping devices, such as e-cigarettes. If you need help quitting, ask your health care provider. Do not use drugs. If you are sexually active, practice  safe sex. Use a condom or other form of protection to prevent STIs. If you do not wish to become pregnant, use a form of birth control. If  you plan to become pregnant, see your health care provider for a prepregnancy visit. Take aspirin only as told by your health care provider. Make sure that you understand how much to take and what form to take. Work with your health care provider to find out whether it is safe and beneficial for you to take aspirin daily. Find healthy ways to manage stress, such as: Meditation, yoga, or listening to music. Journaling. Talking to a trusted person. Spending time with friends and family. Minimize exposure to UV radiation to reduce your risk of skin cancer. Safety Always wear your seat belt while driving or riding in a vehicle. Do not drive: If you have been drinking alcohol. Do not ride with someone who has been drinking. When you are tired or distracted. While texting. If you have been using any mind-altering substances or drugs. Wear a helmet and other protective equipment during sports activities. If you have firearms in your house, make sure you follow all gun safety procedures. Seek help if you have been physically or sexually abused. What's next? Visit your health care provider once a year for an annual wellness visit. Ask your health care provider how often you should have your eyes and teeth checked. Stay up to date on all vaccines. This information is not intended to replace advice given to you by your health care provider. Make sure you discuss any questions you have with your health care provider. Document Revised: 05/27/2021 Document Reviewed: 05/27/2021 Elsevier Patient Education  [place].

## 2023-02-09 NOTE — Progress Notes (Signed)
Subjective:  Patient ID: [name], female    DOB: Apr 07, 1981  Age: 42 y.o. MRN: [id number]  Chief Complaints: COMPLETE PHYSICAL EXAM  Well Adult Physical: Patient here for a comprehensive physical exam.The patient reports no problems Do you take any herbs or supplements that were not prescribed by a doctor? no Are you taking calcium supplements? no Are you taking aspirin daily? no  Encounter for general adult medical examination without abnormal findings  Physical ("At Risk" items are starred): Patient's last physical exam was 1 year ago .  Patient is not afflicted from Stress Incontinence and Urge Incontinence  Patient wears a seat belt, has smoke detectors, has carbon monoxide detectors, practices appropriate gun safety, and wears sunscreen with extended sun exposure. Dental Care: biannual cleanings, brushes and flosses daily. Ophthalmology/Optometry: Annual visit.  Hearing loss: none Vision impairments: none  Menarche: 8 Menstrual History: normal LMP: 01/20/23 Pregnancy history: 3 vaginal Safe at home: yes Self breast exams: yes  [place] Office Visit from 02/09/2023 in [place]  PHQ-2 Total Score 0             Social Hx   Social History   Socioeconomic History   Marital status: Married    Spouse name: Atyana Supinger   Number of children: 3   Years of education: Not on file   Highest education level: Not on file  Occupational History   Not on file  Tobacco Use   Smoking status: Never   Smokeless tobacco: Never  Vaping Use   Vaping Use: Never used  Substance and Sexual Activity   Alcohol use: Yes    Comment: occas   Drug use: No   Sexual activity: Yes    Comment: husband underwent vasectomy  Other Topics Concern   Not on file  Social History Narrative   Not on file   Social Determinants of Health   Financial Resource Strain: Low Risk  (11/18/2021)   Overall Financial Resource Strain (CARDIA)    Difficulty of Paying  Living Expenses: Not hard at all  Food Insecurity: No Food Insecurity (11/18/2021)   Hunger Vital Sign    Worried About Running Out of Food in the Last Year: Never true    [place] in the Last Year: Never true  Transportation Needs: No Transportation Needs (11/18/2021)   PRAPARE - Hydrologist (Medical): No    Lack of Transportation (Non-Medical): No  Physical Activity: Not on file  Stress: Not on file  Social Connections: Not on file   Past Medical History:  Diagnosis Date   Abnormal Pap smear of cervix    Anemia    DVT of lower extremity (deep venous thrombosis) ([place]) 1998   due to compression from large ovarian cyst-HAS VENA CAVA FILTER IN PLACE   HPV (human papilloma virus) infection    Iron deficiency    CONSULT BY Dr. Beryle Beams - "PRESUMED IRON MALABORPTION ANEMIA SECONDARY TO PREVIOUS GASTRIC BYPASS SURGERY"   Left thyroid nodule    Obesity    S/P  Gastric Bypass   Ovarian cyst, left    S/P excision large cyst   Polycystic ovarian syndrome    Past Surgical History:  Procedure Laterality Date   CHOLECYSTECTOMY N/A 10/01/2019   Procedure: LAPAROSCOPIC CHOLECYSTECTOMY WITH INTRAOPERATIVE CHOLANGIOGRAM;  Surgeon: Johnathan Hausen, MD;  Location: [facility];  Service: General;  Laterality: N/A;   gastric by-pass     HYSTEROSCOPY     LEFT  OOPHORECTOMY  11/98   ROUX-EN-Y PROCEDURE  10/2004   THYROID LOBECTOMY Left 02/14/2014   Procedure: LEFT THYROID LOBECTOMY;  Surgeon: [name], MD;  Location: [facility];  Service: General;  Laterality: Left;   THYROIDECTOMY N/A 02/21/2014   Procedure: COMPLETION THYROIDECTOMY;  Surgeon: [name], MD;  Location: [facility];  Service: General;  Laterality: N/A;   VENA CAVA FILTER PLACEMENT  11/98   [place] filter    Family History  Problem Relation Age of Onset   Hypertension Mother    Depression Mother    Osteoporosis Mother    Thyroid disease Mother        hypo   Hypertension Father    COPD Father     Thyroid disease Father        hypo   Sleep apnea Father    Hypertension Maternal Grandmother    Heart disease Maternal Grandmother    Diabetes Maternal Grandmother    Hypertension Maternal Grandfather    Heart disease Maternal Grandfather    Diabetes Maternal Grandfather    Other Daughter        neurofibromatosis    Review of Systems  Constitutional:  Negative for chills, diaphoresis, fatigue and fever.  HENT:  Negative for congestion, ear pain, sinus pain and sore throat.   Respiratory:  Negative for cough and shortness of breath.   Cardiovascular:  Negative for chest pain, palpitations and leg swelling.  Gastrointestinal:  Negative for abdominal pain, constipation, diarrhea, nausea and vomiting.  Endocrine: Negative for polydipsia, polyphagia and polyuria.  Genitourinary:  Negative for dysuria and frequency.  Musculoskeletal:  Negative for arthralgias, back pain and myalgias.  Neurological:  Negative for dizziness and headaches.  Psychiatric/Behavioral:  Negative for dysphoric mood. The patient is not nervous/anxious.      Objective:  BP 118/70   Pulse 83   Temp (!) 97.1 F (36.2 C)   Resp 18   Ht '5\' 4"'$  (1.626 m)   Wt 275 lb 12.8 oz (125.1 kg)   SpO2 97%   BMI 47.34 kg/m      02/09/2023    1:55 PM 07/01/2022    9:37 AM 07/01/2022    7:32 AM  BP/Weight  Systolic BP [id number]  Diastolic BP 70 71 78  Wt. (Lbs) 275.8    BMI 47.34 kg/m2      Physical Exam Vitals reviewed.  Constitutional:      Appearance: Normal appearance. She is normal weight.  HENT:     Right Ear: Tympanic membrane normal.     Left Ear: Tympanic membrane normal.     Nose: No congestion or rhinorrhea.     Mouth/Throat:     Mouth: Mucous membranes are moist.  Neck:     Vascular: No carotid bruit.  Cardiovascular:     Rate and Rhythm: Normal rate and regular rhythm.     Heart sounds: Normal heart sounds.  Pulmonary:     Effort: Pulmonary effort is normal.     Breath sounds: Normal  breath sounds.  Abdominal:     General: Abdomen is flat. Bowel sounds are normal.     Palpations: Abdomen is soft.     Tenderness: There is no abdominal tenderness.  Musculoskeletal:        General: No swelling, tenderness, deformity or signs of injury. Normal range of motion.     Cervical back: Normal range of motion.  Skin:    General: Skin is warm.  Neurological:  General: No focal deficit present.     Mental Status: She is alert and oriented to person, place, and time.  Psychiatric:        Mood and Affect: Mood normal.        Behavior: Behavior normal.     Lab Results  Component Value Date   WBC 7.4 12/14/2022   HGB 13.2 12/14/2022   HCT 39.7 12/14/2022   PLT 306 12/14/2022   GLUCOSE 90 11/18/2021   CHOL 167 11/18/2021   TRIG 57 11/18/2021   HDL 71 11/18/2021   LDLCALC 85 11/18/2021   ALT 14 11/18/2021   AST 20 11/18/2021   NA 145 (H) 11/18/2021   K 4.5 11/18/2021   CL 105 11/18/2021   CREATININE 0.71 11/18/2021   BUN 14 11/18/2021   CO2 23 11/18/2021   TSH 1.603 04/12/2013      Assessment & Plan:  Encounter for physical examination Assessment & Plan: Preventative measures discussed with the patients and the handsout provided.  Orders: -     CBC with Differential/Platelet -     Comprehensive metabolic panel -     TSH -     Hemoglobin A1c  Papillary carcinoma, follicular variant (HCC)  Thyroid cancer [facility]) Assessment & Plan: Patient follow to the endocrinologist -  [facility]  Taking Synthroid 150 mcg OD Liothyronine 5 mcg tablet OD    This is a list of the screening recommended for you and due dates:  Health Maintenance  Topic Date Due   Flu Shot  07/13/2022   DTaP/Tdap/Td vaccine (2 - Td or Tdap) 10/19/2023   Pap Smear  03/20/2024   HIV Screening  Completed   HPV Vaccine  Aged Out   COVID-19 Vaccine  Discontinued   Hepatitis C Screening: USPSTF Recommendation to screen - Ages 37-79 yo.  Discontinued     Follow-up:  Return in about 1 year (around 02/10/2024) for CPE.  An After Visit Summary was printed and given to the patient. I, Neil Crouch have reviewed all documentation for this visit. The documentation on 02/09/23   for the exam, diagnosis, procedures, and orders are all accurate and complete.    Neil Crouch, DNP, [facility] [phone]

## 2023-02-09 NOTE — Assessment & Plan Note (Signed)
Preventative measures discussed with the patients and the handsout provided.

## 2023-02-09 NOTE — Assessment & Plan Note (Addendum)
Patient follow to the endocrinologist -  [facility]  Taking Synthroid 150 mcg OD Liothyronine 5 mcg tablet OD

## 2023-02-10 LAB — HEMOGLOBIN A1C
Est. average glucose Bld gHb Est-mCnc: 97 mg/dL
Hgb A1c MFr Bld: 5 % (ref 4.8–5.6)

## 2023-02-10 LAB — CBC WITH DIFFERENTIAL/PLATELET
Basophils Absolute: 0 10*3/uL (ref 0.0–0.2)
Basos: 0 %
EOS (ABSOLUTE): 0.1 10*3/uL (ref 0.0–0.4)
Eos: 1 %
Hematocrit: 44 % (ref 34.0–46.6)
Hemoglobin: 14.7 g/dL (ref 11.1–15.9)
Immature Grans (Abs): 0 10*3/uL (ref 0.0–0.1)
Immature Granulocytes: 0 %
Lymphocytes Absolute: 2.7 10*3/uL (ref 0.7–3.1)
Lymphs: 25 %
MCH: 31.1 pg (ref 26.6–33.0)
MCHC: 33.4 g/dL (ref 31.5–35.7)
MCV: 93 fL (ref 79–97)
Monocytes Absolute: 0.9 10*3/uL (ref 0.1–0.9)
Monocytes: 9 %
Neutrophils Absolute: 7 10*3/uL (ref 1.4–7.0)
Neutrophils: 65 %
Platelets: 358 10*3/uL (ref 150–450)
RBC: 4.73 x10E6/uL (ref 3.77–5.28)
RDW: 12.7 % (ref 11.7–15.4)
WBC: 10.9 10*3/uL — ABNORMAL HIGH (ref 3.4–10.8)

## 2023-02-10 LAB — COMPREHENSIVE METABOLIC PANEL
ALT: 14 IU/L (ref 0–32)
AST: 16 IU/L (ref 0–40)
Albumin/Globulin Ratio: 2 (ref 1.2–2.2)
Albumin: 4.4 g/dL (ref 3.9–4.9)
Alkaline Phosphatase: 64 IU/L (ref 44–121)
BUN/Creatinine Ratio: 24 — ABNORMAL HIGH (ref 9–23)
BUN: 18 mg/dL (ref 6–24)
Bilirubin Total: 0.3 mg/dL (ref 0.0–1.2)
CO2: 19 mmol/L — ABNORMAL LOW (ref 20–29)
Calcium: 9 mg/dL (ref 8.7–10.2)
Chloride: 107 mmol/L — ABNORMAL HIGH (ref 96–106)
Creatinine, Ser: 0.76 mg/dL (ref 0.57–1.00)
Globulin, Total: 2.2 g/dL (ref 1.5–4.5)
Glucose: 48 mg/dL — ABNORMAL LOW (ref 70–99)
Potassium: 4.9 mmol/L (ref 3.5–5.2)
Sodium: 144 mmol/L (ref 134–144)
Total Protein: 6.6 g/dL (ref 6.0–8.5)
eGFR: 100 mL/min/{1.73_m2} (ref >59)

## 2023-02-10 LAB — TSH: TSH: 0.544 u[IU]/mL (ref 0.450–4.500)

## 2023-02-28 DIAGNOSIS — H7291 Unspecified perforation of tympanic membrane, right ear: Secondary | ICD-10-CM | POA: Diagnosis not present

## 2023-02-28 DIAGNOSIS — H9193 Unspecified hearing loss, bilateral: Secondary | ICD-10-CM | POA: Diagnosis not present

## 2023-02-28 DIAGNOSIS — H9071 Mixed conductive and sensorineural hearing loss, unilateral, right ear, with unrestricted hearing on the contralateral side: Secondary | ICD-10-CM | POA: Diagnosis not present

## 2023-03-01 ENCOUNTER — Encounter: Payer: Self-pay | Admitting: Hematology and Oncology

## 2023-03-16 DIAGNOSIS — L578 Other skin changes due to chronic exposure to nonionizing radiation: Secondary | ICD-10-CM | POA: Diagnosis not present

## 2023-03-16 DIAGNOSIS — L814 Other melanin hyperpigmentation: Secondary | ICD-10-CM | POA: Diagnosis not present

## 2023-03-16 DIAGNOSIS — D2239 Melanocytic nevi of other parts of face: Secondary | ICD-10-CM | POA: Diagnosis not present

## 2023-03-16 DIAGNOSIS — D225 Melanocytic nevi of trunk: Secondary | ICD-10-CM | POA: Diagnosis not present

## 2023-03-16 DIAGNOSIS — D485 Neoplasm of uncertain behavior of skin: Secondary | ICD-10-CM | POA: Diagnosis not present

## 2023-04-07 DIAGNOSIS — E890 Postprocedural hypothyroidism: Secondary | ICD-10-CM | POA: Diagnosis not present

## 2023-04-07 DIAGNOSIS — C73 Malignant neoplasm of thyroid gland: Secondary | ICD-10-CM | POA: Diagnosis not present

## 2023-04-08 DIAGNOSIS — E890 Postprocedural hypothyroidism: Secondary | ICD-10-CM | POA: Diagnosis not present

## 2023-04-08 DIAGNOSIS — C73 Malignant neoplasm of thyroid gland: Secondary | ICD-10-CM | POA: Diagnosis not present

## 2023-04-13 DIAGNOSIS — H6992 Unspecified Eustachian tube disorder, left ear: Secondary | ICD-10-CM | POA: Diagnosis not present

## 2023-05-11 ENCOUNTER — Other Ambulatory Visit (HOSPITAL_COMMUNITY): Payer: Self-pay

## 2023-05-19 DIAGNOSIS — H7291 Unspecified perforation of tympanic membrane, right ear: Secondary | ICD-10-CM | POA: Diagnosis not present

## 2023-05-19 DIAGNOSIS — Z9622 Myringotomy tube(s) status: Secondary | ICD-10-CM | POA: Diagnosis not present

## 2023-06-30 ENCOUNTER — Other Ambulatory Visit (HOSPITAL_COMMUNITY): Payer: Self-pay

## 2023-06-30 DIAGNOSIS — Z01419 Encounter for gynecological examination (general) (routine) without abnormal findings: Secondary | ICD-10-CM | POA: Diagnosis not present

## 2023-06-30 DIAGNOSIS — Z862 Personal history of diseases of the blood and blood-forming organs and certain disorders involving the immune mechanism: Secondary | ICD-10-CM | POA: Diagnosis not present

## 2023-06-30 DIAGNOSIS — Z1231 Encounter for screening mammogram for malignant neoplasm of breast: Secondary | ICD-10-CM | POA: Diagnosis not present

## 2023-06-30 MED ORDER — ALPRAZOLAM 0.5 MG PO TABS
0.5000 mg | ORAL_TABLET | Freq: Three times a day (TID) | ORAL | 0 refills | Status: AC | PRN
  Filled 2023-06-30: qty 10, 4d supply, fill #0

## 2023-06-30 MED ORDER — ONDANSETRON 4 MG PO TBDP
8.0000 mg | ORAL_TABLET | Freq: Two times a day (BID) | ORAL | 0 refills | Status: AC
  Filled 2023-06-30: qty 30, 8d supply, fill #0

## 2023-07-05 ENCOUNTER — Other Ambulatory Visit: Payer: Self-pay | Admitting: Obstetrics and Gynecology

## 2023-07-05 DIAGNOSIS — R928 Other abnormal and inconclusive findings on diagnostic imaging of breast: Secondary | ICD-10-CM

## 2023-07-18 ENCOUNTER — Ambulatory Visit
Admission: RE | Admit: 2023-07-18 | Discharge: 2023-07-18 | Disposition: A | Discharge status: Home / Self Care | Payer: BC Managed Care – PPO | Source: Ambulatory Visit | Attending: Obstetrics and Gynecology | Admitting: Obstetrics and Gynecology

## 2023-07-18 ENCOUNTER — Ambulatory Visit: Admission: RE | Admit: 2023-07-18 | Payer: BC Managed Care – PPO | Source: Ambulatory Visit

## 2023-07-18 ENCOUNTER — Other Ambulatory Visit: Payer: Self-pay | Admitting: Obstetrics and Gynecology

## 2023-07-18 DIAGNOSIS — N6321 Unspecified lump in the left breast, upper outer quadrant: Secondary | ICD-10-CM | POA: Diagnosis not present

## 2023-07-18 DIAGNOSIS — R928 Other abnormal and inconclusive findings on diagnostic imaging of breast: Secondary | ICD-10-CM

## 2023-07-18 DIAGNOSIS — N6322 Unspecified lump in the left breast, upper inner quadrant: Secondary | ICD-10-CM

## 2023-07-21 ENCOUNTER — Ambulatory Visit
Admission: RE | Admit: 2023-07-21 | Discharge: 2023-07-21 | Disposition: A | Discharge status: Home / Self Care | Payer: BC Managed Care – PPO | Source: Ambulatory Visit | Attending: Obstetrics and Gynecology | Admitting: Obstetrics and Gynecology

## 2023-07-21 DIAGNOSIS — N6321 Unspecified lump in the left breast, upper outer quadrant: Secondary | ICD-10-CM

## 2023-07-21 DIAGNOSIS — R928 Other abnormal and inconclusive findings on diagnostic imaging of breast: Secondary | ICD-10-CM

## 2023-07-21 DIAGNOSIS — N6322 Unspecified lump in the left breast, upper inner quadrant: Secondary | ICD-10-CM

## 2023-07-21 DIAGNOSIS — N6012 Diffuse cystic mastopathy of left breast: Secondary | ICD-10-CM | POA: Diagnosis not present

## 2023-07-21 HISTORY — PX: BREAST BIOPSY: SHX20

## 2023-08-02 ENCOUNTER — Other Ambulatory Visit: Payer: Self-pay

## 2023-08-02 ENCOUNTER — Other Ambulatory Visit (HOSPITAL_COMMUNITY): Payer: Self-pay

## 2023-08-02 MED ORDER — LIOTHYRONINE SODIUM 5 MCG PO TABS
5.0000 ug | ORAL_TABLET | Freq: Every day | ORAL | 4 refills | Status: AC
  Filled 2023-08-02 – 2023-08-05 (×2): qty 90, 90d supply, fill #0
  Filled 2023-11-03: qty 90, 90d supply, fill #1
  Filled 2024-01-27: qty 90, 90d supply, fill #2
  Filled 2024-04-30: qty 30, 30d supply, fill #3
  Filled 2024-06-29: qty 30, 30d supply, fill #4

## 2023-08-05 ENCOUNTER — Other Ambulatory Visit (HOSPITAL_COMMUNITY): Payer: Self-pay

## 2023-09-23 ENCOUNTER — Other Ambulatory Visit (HOSPITAL_COMMUNITY): Payer: Self-pay

## 2023-11-07 ENCOUNTER — Other Ambulatory Visit (HOSPITAL_COMMUNITY): Payer: Self-pay

## 2024-01-06 ENCOUNTER — Encounter: Payer: Self-pay | Admitting: Hematology and Oncology

## 2024-01-06 ENCOUNTER — Other Ambulatory Visit (HOSPITAL_COMMUNITY): Payer: Self-pay

## 2024-01-06 MED ORDER — ESCITALOPRAM OXALATE 10 MG PO TABS
ORAL_TABLET | ORAL | 1 refills | Status: DC
  Filled 2024-01-06: qty 90, 90d supply, fill #0
  Filled 2024-03-31: qty 90, 90d supply, fill #1

## 2024-01-09 ENCOUNTER — Encounter: Payer: Self-pay | Admitting: Hematology and Oncology

## 2024-01-09 ENCOUNTER — Other Ambulatory Visit (HOSPITAL_COMMUNITY): Payer: Self-pay

## 2024-01-27 ENCOUNTER — Other Ambulatory Visit (HOSPITAL_COMMUNITY): Payer: Self-pay

## 2024-01-27 MED ORDER — LEVOTHYROXINE SODIUM 150 MCG PO TABS
150.0000 ug | ORAL_TABLET | Freq: Every morning | ORAL | 5 refills | Status: AC
  Filled 2024-01-27: qty 30, 30d supply, fill #0
  Filled 2024-02-28: qty 30, 30d supply, fill #1
  Filled 2024-03-22: qty 30, 30d supply, fill #2
  Filled 2024-04-30: qty 30, 30d supply, fill #3
  Filled 2024-06-29: qty 30, 30d supply, fill #4
  Filled 2024-08-09 – 2024-08-30 (×2): qty 30, 30d supply, fill #5

## 2024-02-23 ENCOUNTER — Encounter: Payer: Self-pay | Admitting: Hematology and Oncology

## 2024-02-29 ENCOUNTER — Other Ambulatory Visit: Payer: Self-pay | Admitting: Endocrinology

## 2024-02-29 DIAGNOSIS — C73 Malignant neoplasm of thyroid gland: Secondary | ICD-10-CM

## 2024-03-03 ENCOUNTER — Encounter: Payer: Self-pay | Admitting: Hematology and Oncology

## 2024-03-03 ENCOUNTER — Telehealth: Admitting: Nurse Practitioner

## 2024-03-03 ENCOUNTER — Other Ambulatory Visit (HOSPITAL_COMMUNITY): Payer: Self-pay

## 2024-03-03 DIAGNOSIS — J020 Streptococcal pharyngitis: Secondary | ICD-10-CM

## 2024-03-03 MED ORDER — AMOXICILLIN 500 MG PO CAPS
500.0000 mg | ORAL_CAPSULE | Freq: Two times a day (BID) | ORAL | 0 refills | Status: AC
Start: 2024-03-03 — End: 2024-03-13
  Filled 2024-03-03: qty 20, 10d supply, fill #0

## 2024-03-03 NOTE — Progress Notes (Signed)
 I have spent 5 minutes in review of e-visit questionnaire, review and updating patient chart, medical decision making and response to patient.   Claiborne Rigg, NP

## 2024-03-03 NOTE — Progress Notes (Signed)

## 2024-03-07 ENCOUNTER — Encounter: Payer: Self-pay | Admitting: Hematology and Oncology

## 2024-03-26 ENCOUNTER — Ambulatory Visit
Admission: RE | Admit: 2024-03-26 | Discharge: 2024-03-26 | Disposition: A | Discharge status: Home / Self Care | Source: Ambulatory Visit | Attending: Endocrinology | Admitting: Endocrinology

## 2024-03-26 DIAGNOSIS — C73 Malignant neoplasm of thyroid gland: Secondary | ICD-10-CM

## 2024-04-02 ENCOUNTER — Other Ambulatory Visit (HOSPITAL_COMMUNITY): Payer: Self-pay

## 2024-04-13 ENCOUNTER — Other Ambulatory Visit (HOSPITAL_COMMUNITY): Payer: Self-pay

## 2024-04-13 ENCOUNTER — Encounter: Payer: Self-pay | Admitting: Hematology and Oncology

## 2024-04-13 MED ORDER — LIOTHYRONINE SODIUM 5 MCG PO TABS
5.0000 ug | ORAL_TABLET | Freq: Every day | ORAL | 11 refills | Status: AC
  Filled 2024-04-13 – 2024-05-28 (×2): qty 30, 30d supply, fill #0
  Filled 2024-08-09: qty 30, 30d supply, fill #1
  Filled 2024-09-19 – 2024-09-20 (×2): qty 30, 30d supply, fill #2
  Filled 2024-10-28: qty 30, 30d supply, fill #3
  Filled 2024-11-30: qty 30, 30d supply, fill #4
  Filled 2025-01-03: qty 30, 30d supply, fill #5

## 2024-04-13 MED ORDER — SYNTHROID 150 MCG PO TABS
150.0000 ug | ORAL_TABLET | Freq: Every day | ORAL | 11 refills | Status: AC
  Filled 2024-04-13 – 2024-05-28 (×2): qty 30, 30d supply, fill #0
  Filled 2024-07-24: qty 30, 30d supply, fill #1
  Filled 2024-09-19 – 2024-09-20 (×2): qty 30, 30d supply, fill #2
  Filled 2024-10-28: qty 30, 30d supply, fill #3
  Filled 2024-11-30: qty 30, 30d supply, fill #4
  Filled 2025-01-03: qty 30, 30d supply, fill #5

## 2024-04-23 ENCOUNTER — Other Ambulatory Visit (HOSPITAL_COMMUNITY): Payer: Self-pay

## 2024-05-01 ENCOUNTER — Other Ambulatory Visit: Payer: Self-pay

## 2024-05-01 ENCOUNTER — Encounter: Payer: Self-pay | Admitting: Hematology and Oncology

## 2024-05-01 ENCOUNTER — Other Ambulatory Visit (HOSPITAL_COMMUNITY): Payer: Self-pay

## 2024-05-02 ENCOUNTER — Other Ambulatory Visit (HOSPITAL_COMMUNITY): Payer: Self-pay

## 2024-05-28 ENCOUNTER — Other Ambulatory Visit: Payer: Self-pay

## 2024-07-03 ENCOUNTER — Other Ambulatory Visit (HOSPITAL_COMMUNITY): Payer: Self-pay

## 2024-07-24 ENCOUNTER — Encounter: Payer: Self-pay | Admitting: Hematology and Oncology

## 2024-07-24 ENCOUNTER — Other Ambulatory Visit (HOSPITAL_COMMUNITY): Payer: Self-pay

## 2024-07-24 ENCOUNTER — Other Ambulatory Visit: Payer: Self-pay

## 2024-07-25 ENCOUNTER — Other Ambulatory Visit (HOSPITAL_COMMUNITY): Payer: Self-pay

## 2024-07-25 MED ORDER — ESCITALOPRAM OXALATE 10 MG PO TABS
10.0000 mg | ORAL_TABLET | Freq: Every day | ORAL | 1 refills | Status: AC
  Filled 2024-07-25: qty 30, 30d supply, fill #0
  Filled 2024-08-30: qty 30, 30d supply, fill #1
  Filled 2024-09-19 – 2024-09-20 (×2): qty 30, 30d supply, fill #2
  Filled 2024-10-28: qty 30, 30d supply, fill #3
  Filled 2024-11-30: qty 30, 30d supply, fill #4
  Filled 2025-01-03: qty 30, 30d supply, fill #5

## 2024-08-08 ENCOUNTER — Other Ambulatory Visit (HOSPITAL_COMMUNITY): Payer: Self-pay

## 2024-08-08 MED ORDER — GOLYTELY 236 G PO SOLR
ORAL | 0 refills | Status: AC
  Filled 2024-08-08: qty 4000, 1d supply, fill #0

## 2024-08-09 ENCOUNTER — Other Ambulatory Visit: Payer: Self-pay

## 2024-09-01 ENCOUNTER — Other Ambulatory Visit (HOSPITAL_COMMUNITY): Payer: Self-pay

## 2024-09-19 ENCOUNTER — Other Ambulatory Visit (HOSPITAL_COMMUNITY): Payer: Self-pay

## 2024-09-20 ENCOUNTER — Other Ambulatory Visit (HOSPITAL_COMMUNITY): Payer: Self-pay

## 2024-09-20 ENCOUNTER — Other Ambulatory Visit: Payer: Self-pay

## 2024-09-20 ENCOUNTER — Encounter: Payer: Self-pay | Admitting: Hematology and Oncology
